# Patient Record
Sex: Female | Born: 1980 | Race: Black or African American | Hispanic: No | Smoking: Never smoker
Health system: Southern US, Community
[De-identification: ages and names within clinical notes are randomized; demographics above are authoritative.]

## PROBLEM LIST (undated history)

## (undated) DIAGNOSIS — Z923 Personal history of irradiation: Secondary | ICD-10-CM

## (undated) DIAGNOSIS — D649 Anemia, unspecified: Secondary | ICD-10-CM

## (undated) DIAGNOSIS — C539 Malignant neoplasm of cervix uteri, unspecified: Secondary | ICD-10-CM

## (undated) HISTORY — DX: Personal history of irradiation: Z92.3

---

## 2004-03-18 ENCOUNTER — Ambulatory Visit (HOSPITAL_COMMUNITY): Admission: RE | Admit: 2004-03-18 | Discharge: 2004-03-18 | Payer: Self-pay | Admitting: *Deleted

## 2004-04-26 HISTORY — PX: TUBAL LIGATION: SHX77

## 2004-06-03 ENCOUNTER — Ambulatory Visit: Payer: Self-pay | Admitting: *Deleted

## 2004-06-03 ENCOUNTER — Inpatient Hospital Stay (HOSPITAL_COMMUNITY): Admission: AD | Admit: 2004-06-03 | Discharge: 2004-06-06 | Payer: Self-pay | Admitting: *Deleted

## 2004-07-16 ENCOUNTER — Ambulatory Visit: Payer: Self-pay | Admitting: Obstetrics and Gynecology

## 2004-07-22 ENCOUNTER — Ambulatory Visit (HOSPITAL_COMMUNITY): Admission: RE | Admit: 2004-07-22 | Discharge: 2004-07-22 | Payer: Self-pay | Admitting: Obstetrics and Gynecology

## 2010-09-11 NOTE — Group Therapy Note (Signed)
NAME:  Peggy Pitts, Peggy Pitts NO.:  [id number]   MEDICAL RECORD NO.:  [id number]          PATIENT TYPE:  WOC   LOCATION:  [facility]                   FACILITY:  [facility]   PHYSICIAN:  Argentina Donovan, MD        DATE OF BIRTH:  10/27/1980   DATE OF SERVICE:  07/16/2004                                    CLINIC NOTE   REASON FOR VISIT:  The patient is a 30 year old gravida 3 para 3-0-0-3 who  had three vaginal deliveries, delivered her last baby February 9, and  desires sterilization. We have discussed bilateral tubal ligation as well as  the alternatives of vasectomy, etc. We have also gone over the risks and the  possible complications of laparoscopic surgery including that related to  hemorrhage, infection, bowel or urinary tract injury, and anesthetic  complications. The patient still wants this done. She weighs 202 pounds, is  5 feet 7 inches, and has a blood pressure of 125/86. She has no allergies,  has never had any surgery, and is in good health. Family history is  noncontributory and physical examination was normal as a postpartum patient.   DIAGNOSIS:  Desiring of voluntary sterilization. Will send the papers over  so that [place] can get this scheduled as soon as possible.      PR/MEDQ  D:  07/16/2004  T:  07/16/2004  Job:  [id number]

## 2010-09-11 NOTE — Op Note (Signed)
NAMESHADY, PADRON                 ACCOUNT NO.:  [id number]   MEDICAL RECORD NO.:  [id number]          PATIENT TYPE:  AMB   LOCATION:  [facility]                           [facility]  [facility]   PHYSICIAN:  Phil D. Okey Dupre, M.D.     DATE OF BIRTH:  1980-10-08   DATE OF PROCEDURE:  07/22/2004  DATE OF DISCHARGE:                                 OPERATIVE REPORT   PREOPERATIVE DIAGNOSIS:  Voluntary sterilization.   POSTOPERATIVE DIAGNOSIS:  Voluntary sterilization.   OPERATION PERFORMED:  Laparoscopic sterilization.   SURGEON:  Javier Glazier. Okey Dupre, M.D.   ANESTHESIA:  General.   SPECIMENS:  None.   POSTOPERATIVE CONDITION:  Satisfactory.   DESCRIPTION OF PROCEDURE:  Under satisfactory general anesthesia, the  patient in the dorsal lithotomy position, the perineum and vagina and  abdomen were prepped and draped in the usual sterile manner.  Bimanual  pelvic examination under anesthesia revealed the uterus was normal size,  shape, consistency, anteflexed, freely movable.  Weighted speculum was  placed in the posterior fourchette of the vagina.  The anterior lip of the  cervix grasped with a single toothed tenaculum.  An acorn cannula was placed  into the cervix and attached to a tenaculum on the anterior lip of the  cervix for mobilization of the uterus.  Veress needle inserted in the  peritoneal cavity at the lower pole of the umbilicus.  However, I could not  get a low enough reading and felt that we perhaps were not in the peritoneal  cavity and therefore, I proceeded into the peritoneum under direct vision as  an open laparoscopy, put the lesser laparoscopic trocar in, removed the  trocar from the sleeve and used Allis clamps to secure air tight area within  the peritoneal cavity and then the CO2 was directed into the peritoneal  cavity under direct vision until the pelvic organs could be easily  visualized and found to be within normal limits.  Each fallopian tube was  grasped in two areas, one in  the midportion and the second approximately 1  cm from the distal end and coagulated until blanching occurred.  At the end  of this as much CO2 as possible was expressed through the sleeve and the  sleeve was removed.  There still seemed to be subcutaneous extravasation of  CO2 at the time of closure and some tympany but no significant bleeding or  injury to other viscus was noted and the incision of fascia was closed with  a 0 Vicryl suture on an atraumatic needle which was continued up to the  subcutaneous closure and Dermabond was used to close the skin edges.  A dry  sterile dressing was applied.  The patient was then transferred to the  recovery room in satisfactory  condition after the tenaculum and the acorn cannula were removed from the  vagina.  There was minimal blood loss.  Tape, instrument, sponge and needle  counts were reported correct at the end of the procedure.  The patient was  in satisfactory condition in the recovery room.  PDR/MEDQ  D:  07/22/2004  T:  07/22/2004  Job:  [id number]

## 2012-01-11 ENCOUNTER — Encounter (HOSPITAL_COMMUNITY): Payer: Self-pay | Admitting: Emergency Medicine

## 2012-01-11 ENCOUNTER — Emergency Department (HOSPITAL_COMMUNITY)
Admission: EM | Admit: 2012-01-11 | Discharge: 2012-01-11 | Disposition: A | Discharge status: Home / Self Care | Payer: Self-pay | Attending: Emergency Medicine | Admitting: Emergency Medicine

## 2012-01-11 DIAGNOSIS — N760 Acute vaginitis: Secondary | ICD-10-CM | POA: Insufficient documentation

## 2012-01-11 DIAGNOSIS — Z202 Contact with and (suspected) exposure to infections with a predominantly sexual mode of transmission: Secondary | ICD-10-CM | POA: Insufficient documentation

## 2012-01-11 DIAGNOSIS — A499 Bacterial infection, unspecified: Secondary | ICD-10-CM | POA: Insufficient documentation

## 2012-01-11 DIAGNOSIS — B9689 Other specified bacterial agents as the cause of diseases classified elsewhere: Secondary | ICD-10-CM | POA: Insufficient documentation

## 2012-01-11 LAB — WET PREP, GENITAL
Trich, Wet Prep: NONE SEEN
Yeast Wet Prep HPF POC: NONE SEEN

## 2012-01-11 MED ORDER — AZITHROMYCIN 250 MG PO TABS
1000.0000 mg | ORAL_TABLET | Freq: Once | ORAL | Status: AC
  Administered 2012-01-11: 1000 mg via ORAL
  Filled 2012-01-11: qty 4

## 2012-01-11 MED ORDER — LIDOCAINE HCL (PF) 1 % IJ SOLN
INTRAMUSCULAR | Status: AC
  Filled 2012-01-11: qty 5

## 2012-01-11 MED ORDER — METRONIDAZOLE 500 MG PO TABS: 500.0000 mg | ORAL_TABLET | Freq: Two times a day (BID) | ORAL | Status: DC

## 2012-01-11 MED ORDER — CEFTRIAXONE SODIUM 250 MG IJ SOLR
250.0000 mg | Freq: Once | INTRAMUSCULAR | Status: AC
  Administered 2012-01-11: 250 mg via INTRAMUSCULAR
  Filled 2012-01-11: qty 250

## 2012-01-11 NOTE — ED Notes (Signed)
Pt sts yellow/green foul smelling vaginal discharge x 2 weeks; pt sts could have been exposed to STD

## 2012-01-11 NOTE — ED Provider Notes (Signed)
History     CSN: [id number]  Arrival date & time 01/11/12  6213   First MD Initiated Contact with Patient 01/11/12 [phone]      Chief Complaint  Patient presents with  . Exposure to STD  . Vaginal Discharge    (Consider location/radiation/quality/duration/timing/severity/associated sxs/prior treatment) HPI Comments: Patient presents with a 2 week history of malodorous vaginal discharge. Patient describes the discharge as white and milky that recently changed to yellow. She reports her husband is a Naval architect and thinks he is being unfaithful while working. She believes he gave her an STD. She denies abdominal pain, abnormal bleeding, menstrual irregularities. Her periods are regular and normal. She has a history of tubal ligation.   Patient is a 31 y.o. female presenting with STD exposure and vaginal discharge.  Exposure to STD  Vaginal Discharge    History reviewed. No pertinent past medical history.  History reviewed. No pertinent past surgical history.  History reviewed. No pertinent family history.  History  Substance Use Topics  . Smoking status: Never Smoker   . Smokeless tobacco: Not on file  . Alcohol Use: Yes     occ    OB History    Grav Para Term Preterm Abortions TAB SAB Ect Mult Living                  Review of Systems  Genitourinary: Positive for vaginal discharge.  All other systems reviewed and are negative.    Allergies  Review of patient's allergies indicates no known allergies.  Home Medications   Current Outpatient Rx  Name Route Sig Dispense Refill  . BIOTIN PO Oral Take 1 tablet by mouth 2 (two) times a week.      BP 164/89  Pulse 120  Temp 98.7 F (37.1 C) (Oral)  Resp 18  SpO2 100%  Physical Exam  Nursing note and vitals reviewed. Constitutional: She is oriented to person, place, and time. She appears well-developed and well-nourished. No distress.  HENT:  Head: Normocephalic and atraumatic.  Mouth/Throat: No  oropharyngeal exudate.  Eyes: Conjunctivae normal are normal. No scleral icterus.  Neck: Normal range of motion. Neck supple.  Cardiovascular: Normal rate and regular rhythm.  Exam reveals no gallop and no friction rub.   No murmur heard. Pulmonary/Chest: Effort normal and breath sounds normal. No respiratory distress. She has no wheezes. She has no rales. She exhibits no tenderness.  Abdominal: Soft. There is no tenderness.  Genitourinary:       Copious amount of clear yellow discharge in vagina. Tenderness noted on speculum exam. Patient endorses tenderness of right adnexa on bimanual exam. No abnormal masses noted on bimanual exam.   Musculoskeletal: Normal range of motion.  Neurological: She is alert and oriented to person, place, and time. Coordination normal.  Skin: Skin is warm and dry. She is not diaphoretic.  Psychiatric: She has a normal mood and affect. Her behavior is normal.    ED Course  Procedures (including critical care time)  Labs Reviewed  WET PREP, GENITAL - Abnormal; Notable for the following:    Clue Cells Wet Prep HPF POC MODERATE (*)     WBC, Wet Prep HPF POC TOO NUMEROUS TO COUNT (*)     All other components within normal limits  GC/CHLAMYDIA PROBE AMP, GENITAL  LAB REPORT - SCANNED   No results found.   No diagnosis found.    MDM  10:12 AM Will do pelvic exam and treat for STD exposure.  Patient treated with 250mg  IM rocephin and 1g Azithromycin. She will go home with Metronidazole for BV. No further evaluation needed at this time.        Emilia Beck, PA-C 01/19/12 1534

## 2012-01-12 LAB — GC/CHLAMYDIA PROBE AMP, GENITAL
Chlamydia, DNA Probe: NEGATIVE
GC Probe Amp, Genital: NEGATIVE

## 2012-01-22 NOTE — ED Provider Notes (Signed)
Medical screening examination/treatment/procedure(s) were performed by non-physician practitioner and as supervising physician I was immediately available for consultation/collaboration.  Flint Melter, MD 01/22/12 1125

## 2012-02-22 ENCOUNTER — Encounter (HOSPITAL_COMMUNITY): Payer: Self-pay | Admitting: *Deleted

## 2012-02-22 ENCOUNTER — Emergency Department (HOSPITAL_COMMUNITY)
Admission: EM | Admit: 2012-02-22 | Discharge: 2012-02-23 | Disposition: A | Discharge status: Home / Self Care | Payer: Medicaid Other | Attending: Emergency Medicine | Admitting: Emergency Medicine

## 2012-02-22 DIAGNOSIS — Z79899 Other long term (current) drug therapy: Secondary | ICD-10-CM | POA: Insufficient documentation

## 2012-02-22 DIAGNOSIS — N888 Other specified noninflammatory disorders of cervix uteri: Secondary | ICD-10-CM

## 2012-02-22 DIAGNOSIS — N9489 Other specified conditions associated with female genital organs and menstrual cycle: Secondary | ICD-10-CM | POA: Insufficient documentation

## 2012-02-22 LAB — CBC WITH DIFFERENTIAL/PLATELET
Basophils Absolute: 0 10*3/uL (ref 0.0–0.1)
Basophils Relative: 0 % (ref 0–1)
Eosinophils Absolute: 1 10*3/uL — ABNORMAL HIGH (ref 0.0–0.7)
Eosinophils Relative: 10 % — ABNORMAL HIGH (ref 0–5)
HCT: 33 % — ABNORMAL LOW (ref 36.0–46.0)
Hemoglobin: 10.7 g/dL — ABNORMAL LOW (ref 12.0–15.0)
Lymphocytes Relative: 29 % (ref 12–46)
Lymphs Abs: 2.9 10*3/uL (ref 0.7–4.0)
MCH: 22.8 pg — ABNORMAL LOW (ref 26.0–34.0)
MCHC: 32.4 g/dL (ref 30.0–36.0)
MCV: 70.2 fL — ABNORMAL LOW (ref 78.0–100.0)
Monocytes Absolute: 0.5 10*3/uL (ref 0.1–1.0)
Monocytes Relative: 5 % (ref 3–12)
Neutro Abs: 5.5 10*3/uL (ref 1.7–7.7)
Neutrophils Relative %: 56 % (ref 43–77)
Platelets: 270 10*3/uL (ref 150–400)
RBC: 4.7 MIL/uL (ref 3.87–5.11)
RDW: 18.1 % — ABNORMAL HIGH (ref 11.5–15.5)
WBC: 9.8 10*3/uL (ref 4.0–10.5)

## 2012-02-22 LAB — WET PREP, GENITAL
Trich, Wet Prep: NONE SEEN
Yeast Wet Prep HPF POC: NONE SEEN

## 2012-02-22 LAB — BASIC METABOLIC PANEL
BUN: 9 mg/dL (ref 6–23)
CO2: 21 mEq/L (ref 19–32)
Calcium: 9.3 mg/dL (ref 8.4–10.5)
Chloride: 100 mEq/L (ref 96–112)
Creatinine, Ser: 0.83 mg/dL (ref 0.50–1.10)
GFR calc Af Amer: >90 mL/min (ref >90)
GFR calc non Af Amer: >90 mL/min (ref >90)
Glucose, Bld: 94 mg/dL (ref 70–99)
Potassium: 3.2 mEq/L — ABNORMAL LOW (ref 3.5–5.1)
Sodium: 135 mEq/L (ref 135–145)

## 2012-02-22 LAB — URINALYSIS, ROUTINE W REFLEX MICROSCOPIC
Bilirubin Urine: NEGATIVE
Glucose, UA: NEGATIVE mg/dL
Ketones, ur: 15 mg/dL — AB
Leukocytes, UA: NEGATIVE
Nitrite: NEGATIVE
Protein, ur: NEGATIVE mg/dL
Specific Gravity, Urine: 1.031 — ABNORMAL HIGH (ref 1.005–1.030)
Urobilinogen, UA: 0.2 mg/dL (ref 0.0–1.0)
pH: 5.5 (ref 5.0–8.0)

## 2012-02-22 LAB — URINE MICROSCOPIC-ADD ON

## 2012-02-22 LAB — POCT PREGNANCY, URINE: Preg Test, Ur: NEGATIVE

## 2012-02-22 MED ORDER — SODIUM CHLORIDE 0.9 % IV BOLUS (SEPSIS)
1000.0000 mL | Freq: Once | INTRAVENOUS | Status: AC
  Administered 2012-02-22: 1000 mL via INTRAVENOUS

## 2012-02-22 NOTE — ED Notes (Signed)
Pt states seen and treated for bacterial vaginiosis. Pt states 2-3 weeks after antiobitic she started having same syptoms. Pt states discharge. Pt states slight bleeding during intercourse.

## 2012-02-22 NOTE — ED Provider Notes (Signed)
History     CSN: [id number]  Arrival date & time 02/22/12  2025   First MD Initiated Contact with Patient 02/22/12 2220      Chief Complaint  Patient presents with  . Vaginitis    (Consider location/radiation/quality/duration/timing/severity/associated sxs/prior treatment) HPI  31 year old female presents complaining of vaginal discharge.  Patient reports for the past 2 weeks she has been having vaginal discharge with clear liquid, and occasionally spotting after sexual intercourse. She also endorsed a vaginal odor. Onset was gradual, persistent, mild to moderate in severity, with no abdominal tenderness, and no urinary symptoms. She denies fever, chills, nausea, vomiting, diarrhea, back pain.  Patient was seen in the ED about month ago for similar symptoms. During her visit she was found to have evidence of bacterial vaginosis in which was treated with Flagyl. She was also given Rocephin and Zithromax for suspected STD. Her culture  result shows no evidence of Chlamydia or gonorrhea infection.  History reviewed. No pertinent past medical history.  Past Surgical History  Procedure Date  . Tubal ligation     History reviewed. No pertinent family history.  History  Substance Use Topics  . Smoking status: Never Smoker   . Smokeless tobacco: Not on file  . Alcohol Use: Yes     occ    OB History    Grav Para Term Preterm Abortions TAB SAB Ect Mult Living                  Review of Systems  All other systems reviewed and are negative.    Allergies  Review of patient's allergies indicates no known allergies.  Home Medications   Current Outpatient Rx  Name Route Sig Dispense Refill  . BIOTIN PO Oral Take 1 tablet by mouth 2 (two) times a week.      BP 136/86  Pulse 108  Temp 98.9 F (37.2 C) (Oral)  Resp 18  SpO2 100%  Physical Exam  Nursing note and vitals reviewed. Constitutional: She appears well-developed and well-nourished. No distress.  HENT:  Head:  Normocephalic and atraumatic.  Eyes: Conjunctivae normal are normal.  Neck: Normal range of motion. Neck supple.  Cardiovascular: Normal rate and regular rhythm.   Pulmonary/Chest: Effort normal and breath sounds normal. She exhibits no tenderness.  Abdominal: Soft. There is no tenderness. Hernia confirmed negative in the right inguinal area and confirmed negative in the left inguinal area.  Genitourinary: There is no rash or lesion on the right labia. There is no rash or lesion on the left labia. Cervix exhibits discharge and friability. Cervix exhibits no motion tenderness. There is bleeding around the vagina. No erythema or tenderness around the vagina. Vaginal discharge found.       Moderate frothy serous sanguianous discharge noted in vaginal canal.  A large tissue mass noted obstructing cervical os.  Mass is friable, nontender, firm.  Unable to remove using alligator forcep.    Bimanual examination were not performed due to finding above.    Musculoskeletal: She exhibits no edema and no tenderness.  Lymphadenopathy:       Right: No inguinal adenopathy present.       Left: No inguinal adenopathy present.  Neurological: She is alert.    ED Course  Procedures (including critical care time)   Labs Reviewed  POCT PREGNANCY, URINE  URINALYSIS, ROUTINE W REFLEX MICROSCOPIC   Results for orders placed during the hospital encounter of 02/22/12  URINALYSIS, ROUTINE W REFLEX MICROSCOPIC  Component Value Range   Color, Urine YELLOW  YELLOW   APPearance CLOUDY (*) CLEAR   Specific Gravity, Urine 1.031 (*) 1.005 - 1.030   pH 5.5  5.0 - 8.0   Glucose, UA NEGATIVE  NEGATIVE mg/dL   Hgb urine dipstick SMALL (*) NEGATIVE   Bilirubin Urine NEGATIVE  NEGATIVE   Ketones, ur 15 (*) NEGATIVE mg/dL   Protein, ur NEGATIVE  NEGATIVE mg/dL   Urobilinogen, UA 0.2  0.0 - 1.0 mg/dL   Nitrite NEGATIVE  NEGATIVE   Leukocytes, UA NEGATIVE  NEGATIVE  POCT PREGNANCY, URINE      Component Value Range     Preg Test, Ur NEGATIVE  NEGATIVE  WET PREP, GENITAL      Component Value Range   Yeast Wet Prep HPF POC NONE SEEN  NONE SEEN   Trich, Wet Prep NONE SEEN  NONE SEEN   Clue Cells Wet Prep HPF POC FEW (*) NONE SEEN   WBC, Wet Prep HPF POC TOO NUMEROUS TO COUNT (*) NONE SEEN  URINE MICROSCOPIC-ADD ON      Component Value Range   Squamous Epithelial / LPF FEW (*) RARE   WBC, UA 3-6  <3 WBC/hpf   RBC / HPF 3-6  <3 RBC/hpf   Bacteria, UA RARE  RARE   Urine-Other MUCOUS PRESENT    CBC WITH DIFFERENTIAL      Component Value Range   WBC 9.8  4.0 - 10.5 K/uL   RBC 4.70  3.87 - 5.11 MIL/uL   Hemoglobin 10.7 (*) 12.0 - 15.0 g/dL   HCT 21.3 (*) 08.6 - 57.8 %   MCV 70.2 (*) 78.0 - 100.0 fL   MCH 22.8 (*) 26.0 - 34.0 pg   MCHC 32.4  30.0 - 36.0 g/dL   RDW 46.9 (*) 62.9 - 52.8 %   Platelets 270  150 - 400 K/uL   Neutrophils Relative 56  43 - 77 %   Neutro Abs 5.5  1.7 - 7.7 K/uL   Lymphocytes Relative 29  12 - 46 %   Lymphs Abs 2.9  0.7 - 4.0 K/uL   Monocytes Relative 5  3 - 12 %   Monocytes Absolute 0.5  0.1 - 1.0 K/uL   Eosinophils Relative 10 (*) 0 - 5 %   Eosinophils Absolute 1.0 (*) 0.0 - 0.7 K/uL   Basophils Relative 0  0 - 1 %   Basophils Absolute 0.0  0.0 - 0.1 K/uL  BASIC METABOLIC PANEL      Component Value Range   Sodium 135  135 - 145 mEq/L   Potassium 3.2 (*) 3.5 - 5.1 mEq/L   Chloride 100  96 - 112 mEq/L   CO2 21  19 - 32 mEq/L   Glucose, Bld 94  70 - 99 mg/dL   BUN 9  6 - 23 mg/dL   Creatinine, Ser 4.13  0.50 - 1.10 mg/dL   Calcium 9.3  8.4 - 24.4 mg/dL   GFR calc non Af Amer >90  >90 mL/min   GFR calc Af Amer >90  >90 mL/min   No results found.  1. Cervical mass  MDM  Pt with vaginal discharge and abnormal bleeding.  On exam, pt appears to have a friable mass in vaginal canal obstructing cervical os.  Mass is friable, nontender, unable to removed using alligator forcep.  My attending has also evaluated it.  Plan to obtain US for further evaluation.  Pt otherwise  denies hx of cancer, unexplained  weight loss, fever, body aches, or nightsweats.     12:10 AM Report given to Dr. Norlene [place], who will follow up on [place] result and will dispo as appropriate.    BP 147/87  Pulse 117  Temp 98.8 F (37.1 C) (Oral)  Resp 19  SpO2 100%  I have reviewed nursing notes and vital signs. I personally reviewed the imaging tests through PACS system  I reviewed available ER/hospitalization records thought the EMR        Fayrene Helper, PA-C 02/23/12 0010

## 2012-02-23 ENCOUNTER — Emergency Department (HOSPITAL_COMMUNITY): Payer: Medicaid Other

## 2012-02-23 ENCOUNTER — Telehealth: Payer: Self-pay | Admitting: Obstetrics & Gynecology

## 2012-02-23 NOTE — ED Provider Notes (Signed)
Medical screening examination/treatment/procedure(s) were conducted as a shared visit with non-physician practitioner(s) and myself.  I personally evaluated the patient during the encounter   Loren Racer, MD 02/23/12 [phone]

## 2012-02-23 NOTE — ED Notes (Signed)
Pt to ultrasound

## 2012-02-23 NOTE — ED Notes (Signed)
MD Otter at bedside. 

## 2012-02-24 LAB — GC/CHLAMYDIA PROBE AMP, GENITAL
Chlamydia, DNA Probe: NEGATIVE
GC Probe Amp, Genital: NEGATIVE

## 2012-02-24 NOTE — Telephone Encounter (Signed)
Rx sent to pharmacy  Peggy Pitts

## 2012-02-28 ENCOUNTER — Ambulatory Visit (INDEPENDENT_AMBULATORY_CARE_PROVIDER_SITE_OTHER): Payer: Self-pay | Admitting: Obstetrics & Gynecology

## 2012-02-28 ENCOUNTER — Encounter: Payer: Self-pay | Admitting: Obstetrics & Gynecology

## 2012-02-28 ENCOUNTER — Other Ambulatory Visit (HOSPITAL_COMMUNITY)
Admission: RE | Admit: 2012-02-28 | Discharge: 2012-02-28 | Disposition: A | Discharge status: Home / Self Care | Payer: Self-pay | Source: Ambulatory Visit | Attending: Obstetrics & Gynecology | Admitting: Obstetrics & Gynecology

## 2012-02-28 VITALS — BP 149/92 | HR 95 | Temp 98.2°F | Ht 66.0 in | Wt 250.6 lb

## 2012-02-28 DIAGNOSIS — C539 Malignant neoplasm of cervix uteri, unspecified: Secondary | ICD-10-CM | POA: Insufficient documentation

## 2012-02-28 DIAGNOSIS — N888 Other specified noninflammatory disorders of cervix uteri: Secondary | ICD-10-CM | POA: Insufficient documentation

## 2012-02-28 HISTORY — PX: CERVICAL BIOPSY: SHX590

## 2012-02-28 NOTE — Progress Notes (Signed)
Patient ID: Peggy Pitts, female   DOB: 09-Jul-1980, 31 y.o.   MRN: [id number]  Chief Complaint  Patient presents with  . Mass    cervical     HPI Peggy Pitts is a 31 y.o. female.  Referral for cervical mass.Seen in ER 10/30. Last pap years ago HPI  No past medical history on file.  Past Surgical History  Procedure Date  . Tubal ligation     No family history on file.  Social History History  Substance Use Topics  . Smoking status: Never Smoker   . Smokeless tobacco: Never Used  . Alcohol Use: No     Comment: occ    No Known Allergies  Current Outpatient Prescriptions  Medication Sig Dispense Refill  . BIOTIN PO Take 1 tablet by mouth 2 (two) times a week.        Review of Systems Review of Systems  Gastrointestinal: Positive for abdominal pain.  Genitourinary: Positive for vaginal bleeding and vaginal discharge.    Blood pressure 149/92, pulse 95, temperature 98.2 F (36.8 C), temperature source Oral, height 5\' 6"  (1.676 m), weight 250 lb 9.6 oz (113.671 kg), last menstrual period 01/30/2012.  Physical Exam Physical Exam  Constitutional: She appears well-developed. No distress.  Abdominal: She exhibits no distension and no mass.  Genitourinary: Vaginal discharge found.       Thin watery discharge with some blood. Necrotic-appearing firm mass at the cervix. Measures about 6 cm and appears to be attached to the cervix. Uterus is normal size no adnexal masses. She gave consent for biopsy after the procedure was explained and timeout was performed. Cervical biopsies were obtained and sent to pathology.  Neurological: She is alert.  Skin: Skin is warm and dry. No pallor.  Psychiatric: She has a normal mood and affect.    Data Reviewed CBC    Component Value Date/Time   WBC 9.8 02/22/2012 2301   RBC 4.70 02/22/2012 2301   HGB 10.7* 02/22/2012 2301   HCT 33.0* 02/22/2012 2301   PLT 270 02/22/2012 2301   MCV 70.2* 02/22/2012 2301   MCH 22.8*  02/22/2012 2301   MCHC 32.4 02/22/2012 2301   RDW 18.1* 02/22/2012 2301   LYMPHSABS 2.9 02/22/2012 2301   MONOABS 0.5 02/22/2012 2301   EOSABS 1.0* 02/22/2012 2301   BASOSABS 0.0 02/22/2012 2301   *RADIOLOGY REPORT*  Clinical Data: Vaginal bleeding. Mass.  TRANSABDOMINAL AND TRANSVAGINAL ULTRASOUND OF PELVIS  Technique: Both transabdominal and transvaginal ultrasound  examinations of the pelvis were performed. Transabdominal technique  was performed for global imaging of the pelvis including uterus,  ovaries, adnexal regions, and pelvic cul-de-sac.  It was necessary to proceed with endovaginal exam following the  transabdominal exam to visualize the uterus and adnexa.  Comparison: None  Findings:  Uterus: The uterus is of normal size and echotexture, measuring  11.3 x 5.8 x 9.0 cm. No focal uterine mass is evident.  Endometrium: The endometrium is within normal limits for age 43 mm.  Right ovary: The right ovary is of normal size and echotexture  measuring 3.3 x 2.1 x 2.0 cm.  Left ovary: The left ovary is of normal size and echotexture  measuring 4.3 x 1.9 x 3.1 cm. A dominant follicle measures 1.8 cm.  Other findings: An irregular heterogeneous mass is present as at  the level of the cervix and within the vagina. This lesion  measures 6.7 x 4.8 x 5.1 cm. Moderate to color Doppler flow is  present  within the mass.  IMPRESSION:  1. 6.7 cm cervical were vaginal mass is most concerning for a  cervical neoplasm.  2. The uterus and adnexa are otherwise within normal limits.  Original Report Authenticated By: Jamesetta Orleans. MATTERN, M.D.    Assessment    Necrotic cervical mass    Plan    Return for BX result          Alexx Mcburney 02/28/2012, 4:46 PM

## 2012-02-28 NOTE — Patient Instructions (Signed)
Cervical Biopsy A cervical biopsy is the removal of a small sample of tissue from the cervix. The cervix is the lowest part of the womb (uterus). The womb contains the opening of the uterus into the vagina (birth canal). A cervical biopsy is usually done to detect cancer of the cervix. A cervical biopsy is also done following an abnormal pap smear, or when an abnormality is seen on the cervix during a pelvic examination. A pap smear is a test of your cervix and cervical canal. During a pap smear, your caregiver uses a small spatula and a brush. He/she uses these tools to gently scrape cells from inside the canal of the cervix and from the surface of your cervix. These cells are sent to a lab for testing. The pap smear is a screening test to detect early changes in cells which suggest cancer will develop. Abnormal pap smears often lead to a colposcopy. A colposcopy is an examination of the surface of the cervix through a magnifying scope. It is usually done if:  You have an abnormal pap smear.  You have a lesion on the cervix, vulva, or vagina, with or without an abnormal pap smear.  Your pap smear suggests the presence of the herpes virus or the human papilloma virus (HPV). This virus can cause genital warts. Several HPV types are linked to the development of cervical cancer.  You have extensive genital warts on the vulva. These are the lips at the opening of the vagina.  You were exposed to DES, or diethylstilbestrol during your mother's pregnancy. This medicine has been linked with abnormal changes in the cervix of women exposed to DES as fetuses.  Your caregiver can safely perform a cervical biopsy while you are pregnant. Your caregiver will often wait until a time after delivery, if the pap smear does not indicate cancer cells. PROCEDURE  Do not douche or have sexual intercourse for at least 24 hours before doing the biopsy. A cervical biopsy is done with the woman lying on her back. Her feet  will be placed in stirrups (foot rests). The biopsy is done when a woman does not have her menstrual period. The caregiver places a speculum inside the woman's vagina. This instrument helps hold open the opening of the vagina. This allows the caregiver to see the cervix and inside of the vagina. The caregiver uses a colposcope to magnify the cervix, vulva, and vagina if necessary. Your caregiver will put a mild solution of vinegar on the area. This solution makes abnormal cells more visible. The caregiver may also use a solution of weak iodine to help see any abnormal cells.  The caregiver takes small pieces of tissue from suspicious areas. When using the colposcope, the technique is called directed cervical punch biopsy. A small amount of paste is placed on the biopsy site to prevent bleeding. You may feel mild discomfort during the biopsy. Your caregiver will record the location of the abnormal areas. Tissue samples will be sent to the lab to a specialist, to see if there are any abnormal cells. The specialist will examine your biopsy under a microscope.  HOME CARE INSTRUCTIONS   Cramping usually goes away within minutes of the cervical biopsy.  Lying down for a few minutes usually prevents light headedness.  Cramping may be treated with over-the-counter medicines. Only take over-the-counter or prescription medicines for pain or discomfort as directed by your caregiver.  Your caregiver will discuss the cervical biopsy test results with you. Problems can range  from normal to mild or slightly abnormal changes in the cells, to cancer of the cervix. Treatments and follow-up depend upon what is found on the biopsy.  You may have a small amount of minor bleeding from the vagina for 1 or 2 days.  For a week (or as instructed) avoid:  Sexual intercourse.  Douches.  Tampons.  Complications may include:  Heavier vaginal bleeding.  Infection.  Allergic reaction to the iodine used. SEEK MEDICAL  CARE IF:   You develop a rash.  You develop abnormal vaginal discharge.  You develop itching or irritation of the vagina or vulva.  You become dizzy or lightheaded. SEEK IMMEDIATE MEDICAL CARE IF:   You develop heavy vaginal bleeding.  You develop a temperature of 100 F (37.9 C) or higher.  You pass out.  Any new problems develop. Document Released: 04/12/2005 Document Revised: 07/05/2011 Document Reviewed: 02/17/2009 [facility] Patient Information 2013 [place], [place].

## 2012-03-03 ENCOUNTER — Ambulatory Visit: Payer: Self-pay

## 2012-03-06 ENCOUNTER — Encounter: Payer: Self-pay | Admitting: Obstetrics & Gynecology

## 2012-03-06 ENCOUNTER — Ambulatory Visit (INDEPENDENT_AMBULATORY_CARE_PROVIDER_SITE_OTHER): Payer: Self-pay | Admitting: Obstetrics & Gynecology

## 2012-03-06 VITALS — BP 133/77 | HR 112 | Temp 97.5°F | Ht 66.0 in | Wt 248.0 lb

## 2012-03-06 DIAGNOSIS — C539 Malignant neoplasm of cervix uteri, unspecified: Secondary | ICD-10-CM | POA: Insufficient documentation

## 2012-03-06 NOTE — Addendum Note (Signed)
Addended by: Toula Moos on: 03/06/2012 04:55 PM   Modules accepted: Orders

## 2012-03-06 NOTE — Patient Instructions (Signed)
Cervical Cancer The cervix is the opening and bottom part of the uterus between the birth canal (vagina) and the uterus (womb). Precancerous changes in the cells on the top layer of the cervix are an early sign that cervical cancer may develop. Cervical cancer is a fairly common cancer. It occurs most often in women ages 40 to 55. Cells of the cervix act very much like skin cells. These cells are exposed to toxins, viruses, and germs (bacteria) that may cause abnormal changes (cervical dysplasia).  Cervical dysplasia (abnormal growth) is judged by the thickness of the layer of abnormal cells. The earliest change that can be seen with a microscope is called mild dysplasia. If not treated, these precancerous changes may become moderate to severe, to cancer cells on the surface of the cervix (carcinoma in situ), to invasive cancer (cancer cells below the surface of the cervix). There are two kinds of cancers of the cervix:  Squamous (scale-like) cell carcinoma, starts in the flat or scale-like cells that line the cervix. This type of cancer is a sexually transmitted infection caused by the human papilloma virus (HPV).  Adenocarcinoma (starts on the surface of a gland) starts in glandular cells that line the cervix. CAUSES The risk of getting cancer of the cervix is related to your lifestyle, sexual history, health, and your immune system. Causes and risks include:  Having a sexually transmitted virus infection. These infections are strongly linked with cancer of the cervix. They include:  Chlamydia.  Herpes.  Human papilloma virus (HPV).  Becoming sexually active before age 18.  Having more than 1 sexual partner, or having sex with someone who has more than one sex partner.  Not using condoms with sexual partners.  Having had cancer of the vagina or vulva. Or having sex with someone whose previous sexual partner had cancer of the cervix or cervical dysplasia.  Having a sexual partner who has  or had cancer of the penis.  Smoking.  Having a weakened immune system. For example, HIV or other immune deficiency disorders (organ transplant).  Being the daughter of a woman who took DES (diethylstilbestrol) during pregnancy.  A history of cancer of the cervix in your sister or mother.  African/American, Hispanic, and Asian/Pacific Islander women have a higher risk of getting cervical cancer.  A previous history of dysplasia (abnormal growth) of the cervix. SYMPTOMS  Early stages of cervical cancer usually have no symptoms. Once the cancer invades the cervix and surrounding tissues, the woman may have vague symptoms, such as:  Abnormal vaginal bleeding or menstrual bleeding that is longer or heavier than usual.  Bleeding after intercourse, douching, or a pap test.  Vaginal bleeding following menopause.  Abnormal vaginal discharge.  Pelvic discomfort or pain.  Having an abnormal Pap test. (A Pap test is an exam where cells are scraped from the cervix and canal of the cervix, and are viewed under a microscope.) Symptoms of more advanced cervical cancer may include:  Loss of appetite or weight loss.  Tiredness (fatigue).  Back and leg pain.  Inability to control urination or bowel movements. DIAGNOSIS  Diagnosis of cervical cancer is found earliest and done best with regular pelvic exams and Pap tests. If abnormalities are found, the Pap test may be repeated in 3 months, or your caregiver may do additional tests, such as:  Colposcopy: A special microscope allows the caregiver to magnify and closely examine the cells of the cervix, vagina, and vulva.  Cervical biopsies: Small tissue samples are   taken from the cervix to be examined under a microscope by a specialist. This can be done in your caregiver's office. This is done to determine if there is dysplasia or cancer cells. You cannot tell the stage of cervical cancer with a cervical biopsy. A cone biopsy removes tissue to be  tested for cancer. It can also be used to remove all the cancerous tissue. In a cone biopsy, a large, cone shaped tissue from the cervix is taken. This procedure can be done by:  Cold cone biopsy or laser cone. Both are done in an operating room under an anesthetic (you are asleep).  LEEP (loop electrosurgical excision procedure). Can be done in a doctor's office with a local anesthetic.  Other tests may be needed, including:  Cystoscopy (looking into the bladder with a lighted tube).  Proctoscopy or Sigmoidoscopy (looking into the rectum and lower intestine with a lighted tube).  Ultrasound.  CT Scan.  MRI.  Laparoscopy (using a lighted tube for examination). There are different stages of cervical cancer:   Stage 0. CIS (carcinoma in situ). This first stage of cancer is the last and most serious stage of dysplasia (see above).  Stage 1. The tumor is in the uterus and cervix only.  Stage 2. The tumor has spread to the upper vagina. The cancer has spread beyond the uterus, but not to the pelvic walls or lower third of the vagina.  Stage 3. The tumor has invaded the side wall of the pelvis, and the lower third of the vagina. Blockage of the ureters (tubes that carry urine) from the tumor may cause urine to back up and swell the kidneys (hydronephrosis).  Stage 4. The tumor has spread to the rectum or bladder. In the later part of this stage, it has also spread to distant organs, like the lungs. If abnormal cells are found early, it may be possible to avoid removing the uterus. If caught at an early stage, a woman can still have children and chances for a cure are good. Once cervical cancer reaches a late stage, more aggressive measures may be needed. Untreated, the cancer may spread to nearby areas or more distant sites in the body, through the blood and lymphatic system. Cervical cancer is not contagious and does not pose a risk to others.  TREATMENT  Options for removal include the  following:  Cone biopsy (see above).  Removal of the entire uterus and cervix (simple hysterectomy).  If the cancer has invaded deeper layers of the cervix and has spread to the uterus, more extensive treatment may be needed. This may include removal of the uterus, cervix, upper vagina, lymph nodes, and surrounding tissue (modified radical hysterectomy). This procedure depends on the extent of the cancer and a woman's age. The ovaries may be left in place or removed.  Sometimes medicines for treating cancer (radiation and/or chemotherapy) can be used. This is done when the cancer has spread beyond the cervix and uterus. These treatments may be used if a woman is not a good candidate for surgery. Age or other medical conditions may prevent a woman from being a good candidate for surgery. Radiation therapy may be used before or after surgery to shrink tumor cells and kill any remaining tumor cells.  A combination of surgery, radiation, and chemotherapy may be needed, depending on the extent of the cancer.  Biological response modifiers (BRMs) are substances that help strengthen the immune system's fight against cancer or infection. Interferon is a BRM that   is sometimes used in the treatment of cervical cancer. It may be used in combination with chemotherapy.  Treatment of squamous cell cancer or adenocarcinoma of the cervix are essentially the same. Side effects of treatment:  A hysterectomy may cause inability to control urination or psychological sexual problems. There may be swelling in the legs if lymph nodes are removed. Occasionally, blood transfusions may be required. Allergic reactions can occur. Hemorrhage, infection, and rarely death, can occur.  Chemotherapy and radiation therapy may cause a wide variety of side effects. Including:  Hair loss.  Tiredness (fatigue).  Lessened ability to fight infections.  Feeling sick to your stomach (nausea).  Vomiting.  Diarrhea.  Urinary  problems.  Atrophic vaginitis (inflammation of the vagina) with painful intercourse.  Biological response modifiers such as interferon may cause flu-like symptoms. These include:  Body aches.  Nausea.  Vomiting.  Fatigue. Treatment of cervical cancer in a pregnant woman:  Treatment of cervical cancer in pregnant women depends on the patient's culture, religious feelings, and ethical considerations.  A pregnant woman with cancer at Stage 0 or Stage 1, with microinvasion of 3mm or less (cancerous tissue has spread to a very small area), can deliver her baby vaginally. She can then be treated 6 weeks after the delivery, with surgery.  Other factors that can determine treatment include:  Size of the tumor.  Location of the tumor.  Stage of the pregnancy.  Desire of the patient to go on with the pregnancy.  Radiation with or without chemotherapy, following delivery and/or surgery, may be advised or necessary to prevent the cancerous tumor from coming back (recurrences).  Delaying treatment until the baby has a better chance to survive is better for the baby. Follow-up on cervical cancer:  After treatment, follow-up is important to look for reoccurrence.  A pelvic exam and Pap test, if the cervix is intact, will be done every 3 months for at least 2 years. After the 2 year phase, a pelvic exam and Pap test will be done every 6 months. Because cancer tends to come back at the same spot or spread to the lungs and liver, chest x-rays and liver function tests are done yearly.  If a woman has had a hysterectomy, the top of the vagina is cuffed or closed. A colposcopy may be done at follow-up visits to examine the vaginal cuff.  Tell your caregiver about any new or worsening problems. LENGTH OF ILLNESS The outcome for a woman with cervical cancer depends on many factors, including:  Overall health.  Age when first diagnosed.  The type and growth of specific cancer cells.  How  far the disease has spread. After treatment, the length of time to live depends on the stage of the cancer. Your caregiver will discuss this with you and help you plan your treatment for the best possible outcome. PREVENTION   A Pap test is done to screen for cervical cancer.  The first Pap test should be done at age 21.  Between ages 21 and 29, Pap tests are repeated every 2 years.  Beginning at age 30, you are advised to have a Pap test every 3 years as long as your past 3 Pap tests have been normal.  Some women have medical problems that increase the chance of getting cervical cancer. Talk to your caregiver about these problems. It is especially important to talk to your caregiver if a new problem develops soon after your last Pap test. In these cases, your caregiver   may recommend more frequent screening and Pap tests.  The above recommendations are the same for women who have or have not gotten the vaccine for HPV (Human Papillomavirus).  If you had a hysterectomy for a problem that was not a cancer or a condition that could lead to cancer, then you no longer need Pap tests. However, even if you no longer need a Pap test, a regular exam is a good idea to make sure no other problems are starting.   If you are between ages 65 and 70, and you have had normal Pap tests going back 10 years, you no longer need Pap tests. However, even if you no longer need a Pap test, a regular exam is a good idea to make sure no other problems are starting.   If you have had past treatment for cervical cancer or a condition that could lead to cancer, you need Pap tests and screening for cancer for at least 20 years after your treatment.  If Pap tests have been discontinued, risk factors (such as a new sexual partner)need to be re-assessed to determine if screening should be resumed.  Some women may need screenings more often if they are at high risk for cervical cancer.  A woman can lower her risk for  getting cervical cancer by:  Quitting smoking.  Waiting to have intercourse until age 18 or later.  Having only one sexual partner in a lifetime.  Using latex condoms.  Practicing safe sex with each sexual encounter.  Remaining celibate (not having sex). Celibate women do not get squamous cell cancer of the cervix, but they can get adenocarcinoma of the cervix.  A woman should ask her sexual partners about their sexual histories. By doing this, she can avoid partners that are high risk.  Identifying early warning signs of cervical cancer is also important. A woman should see her caregiver, and may need to be treated, if she has any of the following signs or symptoms:  Vaginal discharge that does not seem normal.  Vaginal bleeding between periods.  Bleeding with intercourse or after menopause.  Pain with intercourse (dyspareunia).  Vaccines are available to help prevent certain types of human papilloma virus infection and cervical cancer. HOME CARE INSTRUCTIONS   Get a yearly gynecology examination and Pap test, or as advised by your caregiver.  Get the Human papilloma virus (HPV) vaccine.  Do not smoke.  Tell your caregiver if you have a family history of cancer of the cervix.  Do not have sexual intercourse before age 18.  Practice safe sex:  Use condoms.  Have one sex partner.  Make sure you are the only sex partner of your sex partner. SEEK MEDICAL CARE IF:   You have abnormal vaginal bleeding.  You have abnormal vaginal discharge.  You have vaginal bleeding after sexual intercourse, douching, or using tampons.  You develop vaginal bleeding after menopause.  You have pain with sexual intercourse.  You have unexplained weight loss.  You have unexplained tiredness.  You feel pressure with urination and/or with a bowel movement. SEEK IMMEDIATE MEDICAL CARE IF:   You have heavy vaginal bleeding, with or without clots.  You cannot urinate, or you have  blood in your urine.  You have blood or pressure with a bowel movement.  You develop severe back, stomach, or pelvic pain.  You develop an unexplained temperature of 100 F (37.9 C), or higher. Document Released: 04/12/2005 Document Revised: 07/05/2011 Document Reviewed: 02/19/2009 [employer] Patient Information 2013   [employer]  

## 2012-03-06 NOTE — Progress Notes (Signed)
Patient ID: Peggy Pitts, female   DOB: 27-Oct-1980, 31 y.o.   MRN: [id number] Patient reports less vaginal bleeding. Her biopsy shows poorly differentiated SCC. I explained the diagnosis and the need for referral to Gyn Oncology for further evaluation and management. Her questions were answered and she accepts the referral.   ARNOLD,JAMES 03/06/2012 12:18 PM

## 2012-03-13 ENCOUNTER — Other Ambulatory Visit: Payer: Self-pay | Admitting: Obstetrics & Gynecology

## 2012-03-13 ENCOUNTER — Ambulatory Visit (HOSPITAL_COMMUNITY)
Admission: RE | Admit: 2012-03-13 | Discharge: 2012-03-13 | Disposition: A | Discharge status: Home / Self Care | Payer: Self-pay | Source: Ambulatory Visit | Attending: Obstetrics & Gynecology | Admitting: Obstetrics & Gynecology

## 2012-03-13 DIAGNOSIS — C539 Malignant neoplasm of cervix uteri, unspecified: Secondary | ICD-10-CM

## 2012-03-13 MED ORDER — IOHEXOL 300 MG/ML  SOLN
125.0000 mL | Freq: Once | INTRAMUSCULAR | Status: AC | PRN
  Administered 2012-03-13: 125 mL via INTRAVENOUS

## 2012-03-15 ENCOUNTER — Ambulatory Visit: Payer: Self-pay | Attending: Gynecologic Oncology | Admitting: Gynecologic Oncology

## 2012-03-15 ENCOUNTER — Encounter: Payer: Self-pay | Admitting: Gynecologic Oncology

## 2012-03-15 VITALS — BP 122/62 | HR 86 | Temp 98.8°F | Resp 18 | Ht 66.0 in | Wt 249.0 lb

## 2012-03-15 DIAGNOSIS — R599 Enlarged lymph nodes, unspecified: Secondary | ICD-10-CM | POA: Insufficient documentation

## 2012-03-15 DIAGNOSIS — C539 Malignant neoplasm of cervix uteri, unspecified: Secondary | ICD-10-CM | POA: Insufficient documentation

## 2012-03-15 NOTE — Patient Instructions (Signed)
Return for appointments once Harriett Sine calls you with them.

## 2012-03-15 NOTE — Progress Notes (Signed)
Consult Note: Gyn-Onc  Peggy Pitts 31 y.o. female  CC:  Chief Complaint  Patient presents with  . Cervical Cancer    New consult    HPI:  Patient is seen today in consultation at the request of Dr. Debroah Loop for newly diagnosed cervical cancer.  Patient is a 31 year old gravida 3 para 3 who has not had a Pap smear approximately 7 years. At the end of September she began having some vaginal discharge and went to the emergency room where she was told should bacterial vaginosis. She was treated with antibiotics for 14 days. While the odor diminished, the discharge continued. The odor came back and she was again seen in the emergency room on November 4. 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 In addition, she also had transvaginal ultrasound revealed an irregular heterogeneous mass present at the level of the cervix and within the vagina measuring 6.7 x 4.8 x 5.1 cm in size. It is for this reason that she is return to see [place] today.  She had a CT scan performed recently that revealed the following:  CT CHEST  Findings: Ill-defined anterior mediastinal tissue likely represents residual thymic tissue given its indistinct margination.  Small axillary lymph nodes do not appear pathologically enlarged. No pathologic mediastinal adenopathy noted. No significant pulmonary nodules.  IMPRESSION:  1. No findings of malignancy in the thorax.  CT ABDOMEN AND PELVIS  Findings: The liver, spleen, pancreas, and adrenal glands appear unremarkable. The gallbladder and biliary system appear unremarkable.  Small scattered retroperitoneal lymph nodes are not pathologically enlarged by size criteria. Several pericecal lymph nodes are also  within normal limits. Scattered small mesenteric lymph nodes are within normal limits. Left upper internal iliac lymph node measures 1.7 cm in short axis on image 91 of series 2, slight surrounding  stranding. A similar right sided internal iliac node measures 1.4 cm in short axis on  image 94 of series 2. Multiple additional pathologically enlarged pelvic lymph nodes include a 1.8 cm short axis right internal iliac  node and a 1.4 cm short axis left pelvic sidewall node on image 102 of series 2.   Large cervical mass measures 7 cm transversely. Poor fat plane definition noted between this mass in the posterior urinary bladder  wall. A nonspecific 2.3 cm hypodense lesion of the left ovary is observed. The right ovary is somewhat indistinct. Small bilateral inguinal lymph nodes are not pathologically enlarged by size criteria.  IMPRESSION:  1. Cervical mass with bilateral internal iliac and left pelvic sidewall pathologic adenopathy. A definite intact fat plane between the mass in the posterior urinary bladder is difficult to ensure. The mass does not appear to directly involve the rectum.  2. Probable low density cystic lesion of the inferior left ovary, probably a follicle or small complex cyst  Review of Systems: She is currently not sexually active. She does admit to postcoital bleeding for about 2 months with no dyspareunia. Her last cycle was October 7. She had repeat bleeding November 7 was assured that was related to biopsy the procedures are her regular.. She has a change about bladder habits any pain any chest pain or shortness of breath. She does have some, pain which is present when riding in the car and the road is bumpy.  She has a 31 year old son and a 65 year old daughter and a 88-year-old son.  Current Meds:  Outpatient Encounter Prescriptions as of 03/15/2012  Medication  Sig Dispense Refill  . BIOTIN PO Take 1 tablet by mouth 2 (two) times a week.        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 Marital Status: Married    Spouse Name: N/A    Number of Children: N/A  . Years of Education:  N/A   Occupational History  . Not on file.   Social History Main Topics  . Smoking status: Never Smoker   . Smokeless tobacco: Never Used  . Alcohol Use: Yes     Comment: occ  . Drug Use: No  . Sexually Active: Not Currently   Other Topics Concern  . Not on file   Social History Narrative  . No narrative on file    Past Surgical Hx: She has had 3 spontaneous vaginal deliveries. Past Surgical History  Procedure Date  . Tubal ligation     Past Medical Hx: No past medical history on file. None  Family Hx: No family history on file. Her mother has had hypertension and diabetes. There are no GYN cancers in the family. All her siblings are alive and well and her children are alive and well with no medical problems.  Vitals:  Blood pressure 122/62, pulse 86, temperature 98.8 F (37.1 C), temperature source Oral, resp. rate 18, height 5\' 6"  (1.676 m), weight 249 lb (112.946 kg), last menstrual period 03/12/2012.  Physical Exam: Nourished well developed female in no acute distress. BMI is 40.5.  Neck: Supple, no lymphadenopathy, no thyromegaly.  Lungs: Clear to auscultation bilaterally.  Cardiovascular tachycardic regular rhythm no murmurs.  Abdomen: Soft, nontender, nondistended. There is no palpable masses or hepatosplenomegaly. Exam is limited by habitus.  Groins: No lymphadenopathy.  Extremities: No edema.  Pelvic: A strong odor from the perineum. There are no external lesions on the genitalia. Vagina is without lesions. There's a large copious amount of watery purulent fluid that pools in the vagina and the speculum. I visually there is a large mass completely replacing the cervix that is bloody, necrotic, and friable. On bimanual examination she is an 8 cm cervical lesion. No obvious vesicovaginal fistula was identified. Due to the size of the lesion is difficult to ascertain parametrial involvement. Does appear that the parametria is involved on the patient's right side.  There may be sidewall involvement on the left but that is not quite as definitive.  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 On CT scan she does have enlarged lymph nodes worrisome for metastatic disease. . I would recommend that we proceed with a PET/CT to completely a staging disease. She will need to referred for primary chemotherapy and radiation. I discussed the CT scan results with her. She became quite tearful. She appears to be understand the gravity of the situation. We will work with her to get a PET/CT scheduled hopefully next week due to her work schedule we may defer to them next week so that she does not miss work and she's in a job. We will then get her scheduled for chemotherapy and radiation with medical oncology and radiation oncology. I discussed with her that she receive external beam radiation for 4-6 weeks in the depending on her response have some type of internal brachii therapy. Also discussed with her that she'll have weekly chemotherapy consisting of single agent cisplatin. We discussed the rationale for the radiation and chemotherapy she  seems to understand this. Her questions were elicited and answered to her satisfaction. She will await our call next week with her appointments.   Cleda Mccreedy A., MD 03/15/2012, 2:28 PM

## 2012-03-17 ENCOUNTER — Other Ambulatory Visit: Payer: Self-pay | Admitting: *Deleted

## 2012-03-17 ENCOUNTER — Telehealth: Payer: Self-pay | Admitting: *Deleted

## 2012-03-17 ENCOUNTER — Encounter: Payer: Self-pay | Admitting: Radiation Oncology

## 2012-03-17 DIAGNOSIS — C539 Malignant neoplasm of cervix uteri, unspecified: Secondary | ICD-10-CM

## 2012-03-17 NOTE — Telephone Encounter (Signed)
Pt given these appts: Dr Roselind Messier 03/20/12 @ 0800.  PET scan 03/22/12 @ noon.  Still pending is the med onc appt which will be ready when she comes in on monday

## 2012-03-20 ENCOUNTER — Telehealth: Payer: Self-pay | Admitting: Oncology

## 2012-03-20 ENCOUNTER — Ambulatory Visit
Admission: RE | Admit: 2012-03-20 | Discharge: 2012-03-20 | Disposition: A | Discharge status: Home / Self Care | Payer: Self-pay | Source: Ambulatory Visit | Attending: Radiation Oncology | Admitting: Radiation Oncology

## 2012-03-20 ENCOUNTER — Encounter: Payer: Self-pay | Admitting: Radiation Oncology

## 2012-03-20 VITALS — BP 143/79 | HR 106 | Temp 97.9°F | Wt 245.6 lb

## 2012-03-20 DIAGNOSIS — C539 Malignant neoplasm of cervix uteri, unspecified: Secondary | ICD-10-CM

## 2012-03-20 HISTORY — DX: Malignant neoplasm of cervix uteri, unspecified: C53.9

## 2012-03-20 NOTE — Telephone Encounter (Signed)
Pt has np appt. With Dr. Darrold Span on 04/03/12@9 :30. Dagmar Hait gave pt her appt. Time and date Referring Dr. Duard Brady Dx-Cervical Ca

## 2012-03-20 NOTE — Progress Notes (Signed)
Patient here for radiation consultation of newly diagnosed cervical cancer.Married has three children ages 53, 64, and 20.Relatively healthy.Denies pain.

## 2012-03-20 NOTE — Progress Notes (Signed)
Please see the Nurse Progress Note in the MD Initial Consult Encounter for this patient. 

## 2012-03-20 NOTE — Progress Notes (Signed)
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 female who is seen out of the courtesy of Dr. Duard Brady for an opinion concerning radiation therapy as part of management of patient's recently diagnosed advanced cervical cancer.  The patient presented with a two-month history of the foul-smelling vaginal discharge.  She presented to the emergency room and was treated for bacterial vaginosis.  This intervention helped somewhat with the odor but she continued to have significant vaginal discharge requiring pads.  The patient was seen by Dr. Debroah Loop and a mass was noted along the cervix area. A biopsy of this area revealed poorly differentiated squamous cell carcinoma.  Staging workup including a CT scan of the chest abdomen and pelvis, as noted below showed probable metastatic lymphadenopathy.  The patient is scheduled for a PET scan later this week to complete her staging workup.  She is not felt to be a surgical candidate in light of the advanced nature of her presentation.  PREVIOUS RADIATION THERAPY: No  PAST MEDICAL HISTORY:  has a past medical history of Cervical cancer.    PAST SURGICAL HISTORY: Past Surgical History  Procedure Date  . Tubal ligation 2006  . Cervical biopsy 02/28/2012    poorly differentiated squamous cell ca    FAMILY HISTORY: family history includes Diabetes in her mother and Hypertension in her mother.  SOCIAL HISTORY:  reports that she has never smoked. She has never used smokeless tobacco. She reports that she drinks alcohol. She reports that she does not use  illicit drugs.  ALLERGIES: Review of patient's allergies indicates no known allergies.  MEDICATIONS:  Current Outpatient Prescriptions  Medication Sig Dispense Refill  . BIOTIN PO Take 1 tablet by mouth 2 (two) times a week.        REVIEW OF SYSTEMS:  A 15 point review of systems is documented in the electronic medical record. This was obtained by the nursing staff. However, I reviewed this with the patient to discuss relevant findings and make appropriate changes.  She is starting to have some pain in the upper pelvis.   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  She denies any active vaginal bleeding. She did have some bleeding after her biopsy. She denies any hematuria or rectal bleeding.   PHYSICAL EXAM:  weight is 245 lb 9.6 oz (111.403 kg). Her temperature is 97.9 F (36.6 C). Her blood pressure is 143/79 and her pulse is 106.   BP 143/79  Pulse 106  Temp 97.9 F (36.6 C)  Wt 245 lb 9.6 oz (111.403 kg)  LMP 03/12/2012  General Appearance:    Alert, cooperative, no distress, appears stated age  Head:    Normocephalic, without obvious abnormality, atraumatic  Eyes:    PERRL, conjunctiva/corneas clear, EOM's intact,   Ears:    Normal TM's and external ear canals, both ears  Nose:   Nares normal, septum midline, mucosa normal, no drainage    or sinus tenderness  Throat:   Lips, mucosa, and tongue normal; teeth and gums normal  Neck:   Supple, symmetrical, trachea midline, no adenopathy;  thyroid:  no enlargement/tenderness/nodules; no carotid   bruit or JVD  Back:     Symmetric, no curvature, ROM normal, no CVA tenderness  Lungs:     Clear to auscultation bilaterally, respirations unlabored  Chest Wall:    No tenderness or deformity   Heart:    Regular rate and rhythm, , no murmur, rub   or gallop     Abdomen:     Soft, non-tender, bowel sounds active all four quadrants,    no masses, no organomegaly  Genitalia:    Normal female without lesion along the  vulvar area, a speculum exam reveals  copious amounts of watery malodorous fluid, no active bleeding, the cervix is replaced by a large necrotic friable tumor,  on bimanual examination this is estimated to be approximate 7-8 cm in size,  there may be parametrial involvement possibly along the right side.  Rectal:    Normal tone, normal prostate, no masses or tenderness;   guaiac negative stool  Extremities:   Extremities normal, atraumatic, no cyanosis or edema  Pulses:   2+ and symmetric all extremities  Skin:   Skin color, texture, turgor normal, no rashes or lesions  Lymph nodes:   Cervical, supraclavicular, and axillary nodes normal  Neurologic:   CNII-XII intact, normal strength, sensation and reflexes    throughout    LABORATORY DATA:  Lab Results  Component Value Date   WBC 9.8 02/22/2012   HGB 10.7* 02/22/2012   HCT 33.0* 02/22/2012   MCV 70.2* 02/22/2012   PLT 270 02/22/2012   Lab Results  Component Value Date   NA 135 02/22/2012   K 3.2* 02/22/2012   CL 100 02/22/2012   CO2 21 02/22/2012   No results found for this basename: ALT, AST, GGT, ALKPHOS, BILITOT     RADIOGRAPHY: Ct Chest W ContrastUs Transvaginal Non-ob  02/23/2012  *RADIOLOGY REPORT*  Clinical Data: Vaginal bleeding.  Mass.  TRANSABDOMINAL AND TRANSVAGINAL ULTRASOUND OF PELVIS Technique:  Both transabdominal and transvaginal ultrasound examinations of the pelvis were performed. Transabdominal technique was performed for global imaging of the pelvis including uterus, ovaries, adnexal regions, and pelvic cul-de-sac.  It was necessary to proceed with endovaginal exam following the transabdominal exam to visualize the uterus and adnexa.  Comparison:  None  Findings:  Uterus: The uterus is of normal size and echotexture, measuring 11.3 x 5.8 x 9.0 cm.  No focal uterine mass is evident.  Endometrium: The endometrium is within normal limits for age 64 mm.  Right ovary:  The right ovary is of normal size and echotexture  measuring 3.3 x 2.1 x 2.0 cm.  Left ovary: The left ovary is of normal size and echotexture measuring 4.3 x 1.9 x 3.1 cm.  A dominant follicle measures 1.8 cm.  Other findings: An irregular heterogeneous mass is present as at the level of the cervix and within the vagina.  This lesion measures 6.7 x 4.8 x 5.1 cm.  Moderate to color Doppler flow is present within the mass.  IMPRESSION:  1.  6.7 cm cervical were vaginal mass is most concerning for a cervical neoplasm. 2.  The uterus and adnexa are otherwise within normal limits.   Original Report Authenticated By: Jamesetta Orleans. MATTERN, M.D.    Ct Abdomen Pelvis W Contrast  03/13/2012  *RADIOLOGY REPORT*  Clinical Data:  Cervical cancer.   Staging workup.  CT CHEST, ABDOMEN AND PELVIS WITH CONTRAST  Technique:  Multidetector CT imaging of the chest, abdomen and pelvis was  performed following the standard protocol during bolus administration of intravenous contrast.  Contrast: OMNIPAQUE IOHEXOL 300 MG/ML  SOLN  Comparison:  02/22/2012  CT CHEST  Findings:  Ill-defined anterior mediastinal tissue likely represents residual thymic tissue given its indistinct margination. Small axillary lymph nodes do not appear pathologically enlarged. No pathologic mediastinal adenopathy noted.  No significant pulmonary nodules.  IMPRESSION:  1.  No findings of malignancy in the thorax.  CT ABDOMEN AND PELVIS  Findings:  The liver, spleen, pancreas, and adrenal glands appear unremarkable.  The gallbladder and biliary system appear unremarkable.  Small scattered retroperitoneal lymph nodes are not pathologically enlarged by size criteria.  Several pericecal lymph nodes are also within normal limits.  Scattered small mesenteric lymph nodes are within normal limits.  Left upper internal iliac lymph node measures 1.7 cm in short axis on image 91 of series 2, slight surrounding stranding.  A similar right sided internal iliac node measures 1.4 cm in short axis on image 94 of  series 2.  Multiple additional pathologically enlarged pelvic lymph nodes include a 1.8 cm short axis right internal iliac node and a 1.4 cm short axis left pelvic sidewall node on image 102 of series 2.  Large cervical mass measures 7 cm transversely.  Poor fat plane definition noted between this mass in the posterior urinary bladder wall.  A nonspecific 2.3 cm hypodense lesion of the left ovary is observed.  The right ovary is somewhat indistinct.  Small bilateral inguinal lymph nodes are not pathologically enlarged by size criteria.  IMPRESSION:  1.  Cervical mass with bilateral internal iliac and left pelvic sidewall pathologic adenopathy.  A definite intact fat plane between the mass in the posterior urinary bladder is difficult to ensure.  The mass does not appear to directly involve the rectum. 2.  Probable low density cystic lesion of the inferior left ovary, probably a follicle or small complex cyst   Original Report Authenticated By: Gaylyn Rong, M.D.       IMPRESSION: At least stage II-B poorly differentiated squamous cell carcinoma the cervix.  Pending results on the patient's PET scan she would be at good candidate for a definitive course of radiation and radiosensitizing chemotherapy. The patient's radiation would include external beam as well as intracavitary brachytherapy treatments. I discussed the overall treatment course side effects and potential toxicities of radiation therapy in this situation with the patient and her husband. She appears to understand and wishes to proceed with planned course of treatment.  PLAN: Simulation and planning on December 2nd or  3rd with treatments to begin the week of December 9 concomitant with radiosensitizing chemotherapy.  I spent 60 minutes minutes face to face with the patient and more than 50% of that time was spent in counseling and/or coordination of care.   ------------------------------------------------    Billie Lade, PhD, MD

## 2012-03-22 ENCOUNTER — Encounter (HOSPITAL_COMMUNITY): Payer: Self-pay

## 2012-03-22 ENCOUNTER — Encounter (HOSPITAL_COMMUNITY)
Admission: RE | Admit: 2012-03-22 | Discharge: 2012-03-22 | Disposition: A | Discharge status: Home / Self Care | Payer: Self-pay | Source: Ambulatory Visit | Attending: Gynecologic Oncology | Admitting: Gynecologic Oncology

## 2012-03-22 DIAGNOSIS — C539 Malignant neoplasm of cervix uteri, unspecified: Secondary | ICD-10-CM | POA: Insufficient documentation

## 2012-03-22 DIAGNOSIS — N134 Hydroureter: Secondary | ICD-10-CM | POA: Insufficient documentation

## 2012-03-22 DIAGNOSIS — C775 Secondary and unspecified malignant neoplasm of intrapelvic lymph nodes: Secondary | ICD-10-CM | POA: Insufficient documentation

## 2012-03-22 DIAGNOSIS — C772 Secondary and unspecified malignant neoplasm of intra-abdominal lymph nodes: Secondary | ICD-10-CM | POA: Insufficient documentation

## 2012-03-22 LAB — GLUCOSE, CAPILLARY: Glucose-Capillary: 78 mg/dL (ref 70–99)

## 2012-03-22 MED ORDER — FLUDEOXYGLUCOSE F - 18 (FDG) INJECTION
18.9000 | Freq: Once | INTRAVENOUS | Status: AC | PRN
  Administered 2012-03-22: 18.9 via INTRAVENOUS

## 2012-03-27 ENCOUNTER — Telehealth: Payer: Self-pay | Admitting: Radiation Oncology

## 2012-03-27 ENCOUNTER — Ambulatory Visit
Admission: RE | Admit: 2012-03-27 | Discharge: 2012-03-27 | Disposition: A | Discharge status: Home / Self Care | Payer: Medicaid Other | Source: Ambulatory Visit | Attending: Radiation Oncology | Admitting: Radiation Oncology

## 2012-03-27 VITALS — BP 136/73 | HR 110 | Temp 98.5°F | Resp 20 | Wt 233.3 lb

## 2012-03-27 DIAGNOSIS — N898 Other specified noninflammatory disorders of vagina: Secondary | ICD-10-CM | POA: Insufficient documentation

## 2012-03-27 DIAGNOSIS — Z510 Encounter for antineoplastic radiation therapy: Secondary | ICD-10-CM | POA: Insufficient documentation

## 2012-03-27 DIAGNOSIS — C539 Malignant neoplasm of cervix uteri, unspecified: Secondary | ICD-10-CM

## 2012-03-27 DIAGNOSIS — A0472 Enterocolitis due to Clostridium difficile, not specified as recurrent: Secondary | ICD-10-CM | POA: Insufficient documentation

## 2012-03-27 DIAGNOSIS — Z79899 Other long term (current) drug therapy: Secondary | ICD-10-CM | POA: Insufficient documentation

## 2012-03-27 NOTE — Progress Notes (Signed)
IV removed from left antecubital without difficulty.Site clean, dry and intact.Sterile 2 x2 applied and patient instructed to remove in 20 to 30 minutes.Discharged from radiation oncology.

## 2012-03-27 NOTE — Telephone Encounter (Signed)
Met with pt to discuss RO billing. Pt has No Insurance and has applied for MCD with [place] [employer] last week. Pt has also applied for EPP and will bring bank statement back for completion. ACS referral will be done, as well as, pt is not working at this time. However, pt did  provide her husband's wages and they have a 5 person(s) household with income apprx 35,000 yearly.    Dx: 180.9 Cervical  Attending Rad:  JK  Rad Tx: IMRT x40

## 2012-03-27 NOTE — Progress Notes (Signed)
Patient here for iv start for ct simulation and radiation planning.Size 22 g 1" needle used to access left arm antecube without difficulty.Good blood return.Patient isn't a diabetic and has nkda.bun and creatine within normal range.

## 2012-03-29 MED ORDER — SODIUM CHLORIDE 0.9 % IJ SOLN
10.0000 mL | Freq: Once | INTRAMUSCULAR | Status: AC
  Administered 2012-03-29: 10 mL

## 2012-03-29 NOTE — Addendum Note (Signed)
Encounter addended by: Tessa Lerner, RN on: 03/29/2012  3:24 PM<BR>     Documentation filed: Inpatient MAR

## 2012-03-29 NOTE — Addendum Note (Signed)
Encounter addended by: Tessa Lerner, RN on: 03/29/2012  3:21 PM<BR>     Documentation filed: Orders

## 2012-03-29 NOTE — Progress Notes (Signed)
  Radiation Oncology         [phone] ________________________________  Name: Peggy Pitts MRN: [id number]  Date: 03/27/2012  DOB: October 29, 1980  SIMULATION AND TREATMENT PLANNING NOTE  DIAGNOSIS:  Clinical stage II-B squamous cell carcinoma the cervix  NARRATIVE:  The patient was brought to the CT Simulation planning suite.  Identity was confirmed.  All relevant records and images related to the planned course of therapy were reviewed.  The patient freely provided informed written consent to proceed with treatment after reviewing the details related to the planned course of therapy. The consent form was witnessed and verified by the simulation staff.  Then, the patient was set-up in a stable reproducible  supine position for radiation therapy.  CT images were obtained.  Surface markings were placed.  The CT images were loaded into the planning software.  Then the target and avoidance structures were contoured.  Treatment planning then occurred.  The radiation prescription was entered and confirmed.  A total of 1 complex treatment devices were fabricated. I have requested : Intensity Modulated Radiotherapy (IMRT) is medically necessary for this case for the following reason:  Small bowel sparing..  I have ordered: dose calc. PLAN:  The patient will receive 45.0 Gy in 25 fractions directed at the pelvis and periaortic area.  The patient will also receive 5 intracavitary brachytherapy treatments. She will receive radiosensitizing chemotherapy during her external beam treatments.  ________________________________    Billie Lade, PhD, MD

## 2012-03-31 ENCOUNTER — Other Ambulatory Visit: Payer: Self-pay | Admitting: Oncology

## 2012-03-31 DIAGNOSIS — C539 Malignant neoplasm of cervix uteri, unspecified: Secondary | ICD-10-CM

## 2012-04-03 ENCOUNTER — Other Ambulatory Visit: Payer: Self-pay | Admitting: Lab

## 2012-04-03 ENCOUNTER — Encounter: Payer: Self-pay | Admitting: Oncology

## 2012-04-03 ENCOUNTER — Other Ambulatory Visit (HOSPITAL_BASED_OUTPATIENT_CLINIC_OR_DEPARTMENT_OTHER): Payer: Self-pay | Admitting: Lab

## 2012-04-03 ENCOUNTER — Telehealth: Payer: Self-pay | Admitting: Oncology

## 2012-04-03 ENCOUNTER — Ambulatory Visit: Payer: Self-pay

## 2012-04-03 ENCOUNTER — Ambulatory Visit (HOSPITAL_BASED_OUTPATIENT_CLINIC_OR_DEPARTMENT_OTHER): Payer: Self-pay | Admitting: Oncology

## 2012-04-03 ENCOUNTER — Telehealth: Payer: Self-pay | Admitting: *Deleted

## 2012-04-03 VITALS — BP 134/80 | HR 97 | Temp 97.6°F | Resp 18 | Ht 66.0 in | Wt 245.5 lb

## 2012-04-03 DIAGNOSIS — Z23 Encounter for immunization: Secondary | ICD-10-CM

## 2012-04-03 DIAGNOSIS — C539 Malignant neoplasm of cervix uteri, unspecified: Secondary | ICD-10-CM

## 2012-04-03 DIAGNOSIS — D500 Iron deficiency anemia secondary to blood loss (chronic): Secondary | ICD-10-CM

## 2012-04-03 LAB — CBC WITH DIFFERENTIAL/PLATELET
BASO%: 0.6 % (ref 0.0–2.0)
EOS%: 24 % — ABNORMAL HIGH (ref 0.0–7.0)
HCT: 29.7 % — ABNORMAL LOW (ref 34.8–46.6)
LYMPH%: 17.6 % (ref 14.0–49.7)
MCH: 21.9 pg — ABNORMAL LOW (ref 25.1–34.0)
MCHC: 31.6 g/dL (ref 31.5–36.0)
NEUT%: 52.9 % (ref 38.4–76.8)
Platelets: 338 10*3/uL (ref 145–400)
RBC: 4.29 10*6/uL (ref 3.70–5.45)
lymph#: 2.2 10*3/uL (ref 0.9–3.3)
nRBC: 0 % (ref 0–0)

## 2012-04-03 LAB — COMPREHENSIVE METABOLIC PANEL (CC13)
AST: 14 U/L (ref 5–34)
Alkaline Phosphatase: 70 U/L (ref 40–150)
BUN: 8 mg/dL (ref 7.0–26.0)
Creatinine: 0.8 mg/dL (ref 0.6–1.1)

## 2012-04-03 MED ORDER — INFLUENZA VIRUS VACC SPLIT PF IM SUSP
0.5000 mL | Freq: Once | INTRAMUSCULAR | Status: AC
  Administered 2012-04-03: 0.5 mL via INTRAMUSCULAR
  Filled 2012-04-03: qty 0.5

## 2012-04-03 NOTE — Progress Notes (Signed)
Checked in new patient. She has no insurance but has applied for [employer] and financial assistance with [employer]. I will advised Dr. Darrold Span patient is uninsured.

## 2012-04-03 NOTE — Progress Notes (Signed)
[facility] NEW PATIENT EVALUATION   Name: Peggy Pitts Date: 04/03/2012 MRN: [id number] DOB: 12-Aug-1980  REFERRING PHYSICIAN: Cleda Mccreedy, MD [facility]  Scheryl Darter, MD  REASON FOR REFERRAL: cervical cancer, for sensitizing chemotherapy with radiation therapy.    HISTORY OF PRESENT ILLNESS:Peggy Pitts is a 31 y.o. female who is seen, alone for visit today, in new patient consultation at the request of Dr Cleda Mccreedy because of recently diagnosed poorly differentiated squamous cell carcinoma of cervix, for consideration of sensitizing chemotherapy with radiation therapy. She has met with Dr Roselind Messier and is scheduled to begin RT on 04-05-12.  Patient presented to ED 01-11-12 with complaints of foul vaginal odor x 2 weeks, cultures negative for chlamydia and GC and treatment given with rocephin and zithromax. She returned to ED on 02-22-12 with ongoing problem, with finding of large mass obstructing cervical os. She was referred to Dr Scheryl Darter, with hx of last PAP several years previously; biopsy of the cervical mass showed invasive poorly differentiated squamous cell carcinoma (path [id number]). She had transabdominal and transvaginal US during this evaluation. CT CAP 03-13-12 had probable residual thymic tissue in mediastinum, 7 cm cervical mass with poor fat plane between this and posterior bladder, bilateral internal iliac and left pelvic sidewall adenopathy. She saw Dr Duard Brady on 03-15-12, clinical stage IIB. She was then seen by Dr Roselind Messier and had PET 03-22-12, wih uptake in retroperitoneal and pelvic nodes and  the cervical mass; she also had mild fullness of right renal collecting system. Dr Roselind Messier plans RT in 25 fractions to pelvis and periaortics, presently set up 04-05-12 thru 05-11-12. She has not attended chemotherapy teaching class yet.   REVIEW OF SYSTEMS: No headaches or other neurologic symptoms. No hearing problems, no visual problems. Last dental exam ~ 1.5 yrs ago,  no known dental problems. No changes in breasts, has never had mammograms (31 yo). No cardiac symptoms. Usual weight 240 lbs. Appetite decreased recently due to anxiety with diagnosis. Bowels move ~ qod as usual. No bladder symptoms. Has had light bleeding, may have had menstrual period last week (?). Has had very bothersome vaginal odor. Has had BTL, states not sexually active. No arthritis. No other bleeding. No LE swelling. Some NP cough today without shortness of breath. Some pain LLQ after stands at work all day, improved with aleve.  ALLERGIES: Review of patient's allergies indicates no known allergies.  PAST MEDICAL HISTORY:  G3P3 BTL   Never transfused  CURRENT MEDICATIONS: We administered influenza  inactive virus vaccine.    SOCIAL HISTORY:  reports that she has never smoked. She has never used smokeless tobacco. She reports that she drinks alcohol. She reports that she does not use illicit drugs. ETOH use is ~ 2 drinks per year. 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 Children are son age 28 in 9th grade, daughter age 51 in 49th grade and son age 56 in 2nd grade. She does not have insurance coverage; [facility] financial staff is aware and assisting.  FAMILY HISTORY: family history includes Diabetes in her mother and Hypertension in her mother. Mother died with complications of DM and HTN Sister had hysterectomy for some malignancy Children healthy  LABORATORY DATA:  Results for orders placed in visit on 04/03/12 (from the past 48 hour(s))  CBC WITH DIFFERENTIAL     Status: Abnormal   Collection Time   04/03/12 10:17 AM      Component Value Range Comment  WBC 12.3 (*) 3.9 - 10.3 10e3/uL    NEUT# 6.5  1.5 - 6.5 10e3/uL    HGB 9.4 (*) 11.6 - 15.9 g/dL    HCT 78.2 (*) 95.6 - 46.6 %    Platelets 338  145 - 400 10e3/uL    MCV 69.2 (*) 79.5 - 101.0 fL    MCH 21.9 (*) 25.1 - 34.0 pg    MCHC 31.6  31.5 - 36.0 g/dL    RBC 2.13  0.86 - 5.78 10e6/uL     RDW 18.2 (*) 11.2 - 14.5 %    lymph# 2.2  0.9 - 3.3 10e3/uL    MONO# 0.6  0.1 - 0.9 10e3/uL    Eosinophils Absolute 2.9 (*) 0.0 - 0.5 10e3/uL    Basophils Absolute 0.1  0.0 - 0.1 10e3/uL    NEUT% 52.9  38.4 - 76.8 %    LYMPH% 17.6  14.0 - 49.7 %    MONO% 4.9  0.0 - 14.0 %    EOS% 24.0 (*) 0.0 - 7.0 %    BASO% 0.6  0.0 - 2.0 %    nRBC 0  0 - 0 %      CMET and Mg available after visit have K 3.3, glu 101, alb 3.4 and Mg 2.0  RADIOGRAPHY:  CT CAP 03-13-12 Comparison: 02/22/2012  CT CHEST  Findings: Ill-defined anterior mediastinal tissue likely  represents residual thymic tissue given its indistinct margination.  Small axillary lymph nodes do not appear pathologically enlarged.  No pathologic mediastinal adenopathy noted.  No significant pulmonary nodules.  IMPRESSION:  1. No findings of malignancy in the thorax.  CT ABDOMEN AND PELVIS  Findings: The liver, spleen, pancreas, and adrenal glands appear  unremarkable.  The gallbladder and biliary system appear unremarkable.  Small scattered retroperitoneal lymph nodes are not pathologically  enlarged by size criteria. Several pericecal lymph nodes are also  within normal limits. Scattered small mesenteric lymph nodes are  within normal limits.  Left upper internal iliac lymph node measures 1.7 cm in short axis  on image 91 of series 2, slight surrounding stranding. A similar  right sided internal iliac node measures 1.4 cm in short axis on  image 94 of series 2. Multiple additional pathologically enlarged  pelvic lymph nodes include a 1.8 cm short axis right internal iliac  node and a 1.4 cm short axis left pelvic sidewall node on image 102  of series 2.  Large cervical mass measures 7 cm transversely. Poor fat plane  definition noted between this mass in the posterior urinary bladder  wall. A nonspecific 2.3 cm hypodense lesion of the left ovary is  observed. The right ovary is somewhat indistinct.  Small bilateral inguinal  lymph nodes are not pathologically  enlarged by size criteria.  IMPRESSION:  1. Cervical mass with bilateral internal iliac and left pelvic  sidewall pathologic adenopathy. A definite intact fat plane  between the mass in the posterior urinary bladder is difficult to  ensure. The mass does not appear to directly involve the rectum.  2. Probable low density cystic lesion of the inferior left ovary,  probably a follicle or small complex cyst      NUCLEAR MEDICINE PET SKULL BASE TO THIGH 03-22-12 Fasting Blood Glucose: 78  Technique: 18.9 mCi F-18 FDG was injected intravenously. CT data  was obtained and used for attenuation correction and anatomic  localization only. (This was not acquired as a diagnostic CT  examination.) Additional exam technical data entered on  technologist worksheet.  Comparison: CT chest abdomen pelvis dated 03/13/2012  Findings:  Neck: No hypermetabolic lymph nodes in the neck.  Chest: No hypermetabolic mediastinal or hilar nodes. No  suspicious pulmonary nodules on the CT scan.  Abdomen/Pelvis: Cervical mass, corresponding to known cervical  cancer, max SUV 37.4.  Retroperitoneal/pelvic lymphadenopathy, including:  --5 mm short-axis aortocaval node (series 2/image 153), max SUV 4.8  --1.5 cm short-axis left common iliac node (series 2/image 173),  max SUV 14.0  --1.4 cm short-axis right common iliac node (series 2/image 176),  max SUV 16.1  --Right internal iliac node (series 2/image 23), better visualized  on prior CT  --1.5 cm short-axis left pelvic sidewall node (series 2/image 27),  max SUV 14.8  No abnormal hypermetabolic activity within the liver, pancreas,  adrenal glands, or spleen.  Very mild fullness of the right renal collecting system with  associated mild hydroureter. This appearance is worrisome for  distal ureteral obstruction/involvement by tumor.  Skeleton: Heterogeneous osseous uptake. No focal hypermetabolic  activity to suggest  skeletal metastasis.  IMPRESSION:  Cervical mass, corresponding to known cervical cancer, max SUV  37.4.  Associated retroperitoneal/pelvic nodal metastases, as described  above.  Mild fullness of the right renal collecting system with associated  mild right hydroureter, worrisome for distal ureteral  obstruction/involvement by tumor.    Medications:   We will send in prescriptions for antiemetics and will request OTC ferrous gluconate or ferrous fumarate, possibly to [facility] if she qualifies for assistance from [facility].     PHYSICAL EXAM:  height is 5\' 6"  (1.676 m) and weight is 245 lb 8 oz (111.358 kg). Her oral temperature is 97.6 F (36.4 C). Her blood pressure is 134/80 and her pulse is 97. Her respiration is 18.   Very pleasant AA lady looks stated age, obese. HEENT: PERRL, not icteric, oral mucosa clear, good dentition, neck supple without JVD or thyroid mass. Lymphatics: no cervical, supraclavicular, axillary, inguinal adenopathy. Lungs clear to auscultation and percussion. Back not tender to palpation. Heart RRR, no murmur or gallop. Breasts bilaterally without dominant mass, skin or nipple findings. Abdomen soft, nontender, normal bowel sounds, no appreciable organomegaly or mass. LE no edema, cords, tenderness. Skin without rash or ecchymosis. Neuro: CN, motor, sensory, cerebellar nonfocal. Vaginal odor present.     Patient was seen also by gyn oncology RN with some suggestions for managing the vaginal odor.   We have discussed all of history as above and findings on CTs/ PET, as well as recommendation for radiation with sensitizing cisplatin. We have discussed this  chemotherapy, including schedule of treatments,necessary hydration and possible side effects including nausea, cytopenias, fatigue, hair loss; she will attend teaching class prior to treatment. Patient seems to follow the discussion well and is in agreement with plan.   IMPRESSION / PLAN:  1.Squamous  cell carcinoma of cervix: plan RT with sensitizing chemotherapy. Will start chemotherapy on 04-10-12. 2.anemia related to gyn bleeding: will begin oral iron as above. May need IV iron or transfusion to keep in adequate range for RT 3.BTL 4.flu vaccine given today  She will have CDDP on 12-16, 12-23, 12-30, 1-6 and 1-13. She will have chemistries and Mg++ every Friday and CBC day of each Rx. I will see her again on 12-20 or sooner if needed; she understands that she can call at any time if questions or concerns.  Asjah Rauda P, MD 04/03/2012 11:15 AM

## 2012-04-03 NOTE — Telephone Encounter (Signed)
Per staff phone call and POF I have scheduled appts. JMW  

## 2012-04-03 NOTE — Telephone Encounter (Signed)
appts made and printed for pt aom °

## 2012-04-03 NOTE — Patient Instructions (Addendum)
You can fax FMLA paperwork to Dr Darrold Span [phone] or bring it to the office and tell front receptionist that it is for Dr Darrold Span.  Flu shot given today  Need to start iron tablets, ~ 324 mg daily.  No slow release types because these are too hard to absorb.  Ferrous sulfate is most common type but sometimes upsets stomach more; ferrous gluconate or ferrous fumarate are also over the counter and sometimes easier on stomach.

## 2012-04-04 ENCOUNTER — Encounter: Payer: Self-pay | Admitting: *Deleted

## 2012-04-04 ENCOUNTER — Other Ambulatory Visit: Payer: Self-pay

## 2012-04-04 ENCOUNTER — Telehealth: Payer: Self-pay | Admitting: Radiation Oncology

## 2012-04-04 DIAGNOSIS — C539 Malignant neoplasm of cervix uteri, unspecified: Secondary | ICD-10-CM

## 2012-04-04 MED ORDER — FERROUS GLUCONATE 324 (38 FE) MG PO TABS: ORAL_TABLET | ORAL | Status: DC

## 2012-04-04 MED ORDER — ONDANSETRON HCL 8 MG PO TABS: 8.0000 mg | ORAL_TABLET | Freq: Three times a day (TID) | ORAL | Status: DC | PRN

## 2012-04-04 MED ORDER — LORAZEPAM 1 MG PO TABS: ORAL_TABLET | ORAL | Status: DC

## 2012-04-04 NOTE — Progress Notes (Signed)
Left message in voice mail in cell phone that prescriptions called to [facility] pharmacy and that she had funds available to cover prescriptions.

## 2012-04-04 NOTE — Telephone Encounter (Signed)
**  CORRECTION**   INDIGENT APPROVED 100%  FAMILY SIZE: 5  HH INC: 30,735.00  MOD POV: 27,570.00-34,462.50 VALID DATES: 04/04/2012-10/03/2012  100% INDIGENT - PLEASE APPLY DISCOUNT TO ANY 6 MONTHS PRIOR AND ALL CURRENT BILL.

## 2012-04-04 NOTE — Telephone Encounter (Signed)
INDIGENT APPROVED 100% FAMILY SIZE: 5 HH INC: 30,735.00 MOD POV: 11,490.00 VALID DATES: 04/04/2012-10/03/2012 100% INDIGENT -  PLEASE APPLY DISCOUNT TO ANY  6 MONTHS PRIOR AND ALL CURRENT BILL.   [facility] $400

## 2012-04-05 ENCOUNTER — Ambulatory Visit
Admission: RE | Admit: 2012-04-05 | Discharge: 2012-04-05 | Disposition: A | Discharge status: Home / Self Care | Payer: Medicaid Other | Source: Ambulatory Visit | Attending: Radiation Oncology | Admitting: Radiation Oncology

## 2012-04-05 ENCOUNTER — Ambulatory Visit: Admission: RE | Admit: 2012-04-05 | Payer: Medicaid Other | Source: Ambulatory Visit | Admitting: Radiation Oncology

## 2012-04-05 DIAGNOSIS — C539 Malignant neoplasm of cervix uteri, unspecified: Secondary | ICD-10-CM

## 2012-04-06 ENCOUNTER — Ambulatory Visit
Admission: RE | Admit: 2012-04-06 | Discharge: 2012-04-06 | Disposition: A | Discharge status: Home / Self Care | Payer: Medicaid Other | Source: Ambulatory Visit | Attending: Radiation Oncology | Admitting: Radiation Oncology

## 2012-04-06 DIAGNOSIS — C539 Malignant neoplasm of cervix uteri, unspecified: Secondary | ICD-10-CM

## 2012-04-06 MED ORDER — BIAFINE EX EMUL
CUTANEOUS | Status: DC | PRN
  Administered 2012-04-06: 1 via TOPICAL

## 2012-04-06 NOTE — Progress Notes (Signed)
   Department of Radiation Oncology  04/05/2012  Phone:  [phone] Fax:        [phone]   Intensity modulated radiation therapy device note  Today the patient began radiation therapy directed at the pelvis and periaortic area. Patient will be treated with helical intensity modulated radiation therapy.   patient had development of 8.2 sinogram segments. This constitutes 1 intensity modulated radiation therapy device.  -----------------------------------  Billie Lade, PhD, MD

## 2012-04-06 NOTE — Progress Notes (Signed)
Routine of clinic reviewed with patient.Explained side effects of treatment and how to take care of self.Given Radiation Therapy and You Booklet and biafine to apply when skin changes arise in next 2 to three weeks.Informed to push water intake to prevent bladder infection and get imodium ad In case diarrhea occurs.Informed of weekly doctor assessment but may be seen during treatment if problems occur.

## 2012-04-07 ENCOUNTER — Ambulatory Visit (HOSPITAL_BASED_OUTPATIENT_CLINIC_OR_DEPARTMENT_OTHER)
Admission: RE | Admit: 2012-04-07 | Discharge: 2012-04-07 | Disposition: A | Discharge status: Home / Self Care | Payer: Medicaid Other | Source: Ambulatory Visit | Attending: Radiation Oncology | Admitting: Radiation Oncology

## 2012-04-07 NOTE — Progress Notes (Signed)
Patient brought to nursing after radiation treatment with questions regarding nausea and vomiting.Stomach upset and difficulty keeping down po intake.Instructed to take zofran every morning prior to treatment as radiation may cause nausea being we are treating periaortics.To try ativan at bedtime as patient does work during the day.

## 2012-04-10 ENCOUNTER — Other Ambulatory Visit: Payer: Self-pay | Admitting: Lab

## 2012-04-10 ENCOUNTER — Ambulatory Visit (HOSPITAL_BASED_OUTPATIENT_CLINIC_OR_DEPARTMENT_OTHER): Payer: Self-pay

## 2012-04-10 ENCOUNTER — Ambulatory Visit
Admission: RE | Admit: 2012-04-10 | Discharge: 2012-04-10 | Disposition: A | Discharge status: Home / Self Care | Payer: Medicaid Other | Source: Ambulatory Visit | Attending: Radiation Oncology | Admitting: Radiation Oncology

## 2012-04-10 VITALS — BP 141/78 | HR 104 | Temp 99.0°F | Wt 236.2 lb

## 2012-04-10 VITALS — BP 119/68 | HR 102 | Temp 99.9°F | Resp 18

## 2012-04-10 DIAGNOSIS — C539 Malignant neoplasm of cervix uteri, unspecified: Secondary | ICD-10-CM

## 2012-04-10 DIAGNOSIS — Z5111 Encounter for antineoplastic chemotherapy: Secondary | ICD-10-CM

## 2012-04-10 LAB — CBC WITH DIFFERENTIAL/PLATELET
Basophils Absolute: 0.1 10*3/uL (ref 0.0–0.1)
Eosinophils Absolute: 0.7 10*3/uL — ABNORMAL HIGH (ref 0.0–0.5)
HCT: 30 % — ABNORMAL LOW (ref 34.8–46.6)
HGB: 9.6 g/dL — ABNORMAL LOW (ref 11.6–15.9)
LYMPH%: 7.2 % — ABNORMAL LOW (ref 14.0–49.7)
MCV: 68.8 fL — ABNORMAL LOW (ref 79.5–101.0)
MONO%: 5.7 % (ref 0.0–14.0)
NEUT#: 8.8 10*3/uL — ABNORMAL HIGH (ref 1.5–6.5)
NEUT%: 80.1 % — ABNORMAL HIGH (ref 38.4–76.8)
Platelets: 299 10*3/uL (ref 145–400)

## 2012-04-10 MED ORDER — DEXAMETHASONE SODIUM PHOSPHATE 4 MG/ML IJ SOLN
20.0000 mg | Freq: Once | INTRAMUSCULAR | Status: AC
  Administered 2012-04-10: 20 mg via INTRAVENOUS

## 2012-04-10 MED ORDER — SODIUM CHLORIDE 0.9 % IV SOLN
150.0000 mg | Freq: Once | INTRAVENOUS | Status: AC
  Administered 2012-04-10: 150 mg via INTRAVENOUS
  Filled 2012-04-10: qty 5

## 2012-04-10 MED ORDER — SODIUM CHLORIDE 0.9 % IV SOLN
39.4000 mg/m2 | Freq: Once | INTRAVENOUS | Status: AC
  Administered 2012-04-10: 90 mg via INTRAVENOUS
  Filled 2012-04-10: qty 90

## 2012-04-10 MED ORDER — ONDANSETRON 16 MG/50ML IVPB (CHCC)
16.0000 mg | Freq: Once | INTRAVENOUS | Status: AC
  Administered 2012-04-10: 16 mg via INTRAVENOUS

## 2012-04-10 MED ORDER — SODIUM CHLORIDE 0.9 % IV SOLN
Freq: Once | INTRAVENOUS | Status: AC
Start: 2012-04-10 — End: 2012-04-10
  Administered 2012-04-10: 09:00:00 via INTRAVENOUS

## 2012-04-10 MED ORDER — POTASSIUM CHLORIDE 2 MEQ/ML IV SOLN
Freq: Once | INTRAVENOUS | Status: AC
  Administered 2012-04-10: 09:00:00 via INTRAVENOUS
  Filled 2012-04-10: qty 10

## 2012-04-10 NOTE — Progress Notes (Signed)
[facility]    Radiation Oncology [address] [place]     Maryln Gottron, M.D. [place], [place] [address]               Billie Lade, M.D., Ph.D. Phone: [phone]      Molli Hazard A. Kathrynn Running, M.D. Fax: [phone]      Radene Gunning, M.D., Ph.D.         Lurline Hare, M.D.         Grayland Jack, M.D Weekly Treatment Management Note  Name: Peggy Pitts     MRN: [id number]        CSN: [id number] Date: 04/10/2012      DOB: [birth year]  CC: Peggy Oats, MD         Default    Status: Outpatient  Diagnosis: The encounter diagnosis was Cervical cancer.  Current Dose: 7.2  Current Fraction: 4  Planned Dose: 45.0 Gy  Narrative: Peggy Pitts was seen today for weekly treatment management. The chart was checked and MVCT  were reviewed.  Since last Thursday the patient has had significant nausea.  She did have prescriptions for Zofran and Ativan as of last week. I am unsure whether she used these prior to her treatment. I have instructed her to take Ativan or Zofran 1 hour prior to her radiation therapy in light of her significant nausea. She has had some increased flatus and some loose bowels but no significant diarrhea at this time. She continues to have a significant vaginal discharge but no vaginal bleeding. She is using approximately 5-6 pads per day in light of her drainage.   Review of patient's allergies indicates no known allergies.  Current Outpatient Prescriptions  Medication Sig Dispense Refill  . BIOTIN PO Take 1 tablet by mouth 2 (two) times a week.      . ferrous gluconate (FERGON) 324 MG tablet Take daily on empty stomach with OJ  30 tablet  4  . LORazepam (ATIVAN) 1 MG tablet Take 0.5 to 1 mg under tongue or swallow every 6 hours as needed for nausea.  Will make drowsy.  30 tablet  0  . naproxen sodium (ANAPROX) 220 MG tablet Take 220 mg by mouth 2 (two) times daily as needed.      . ondansetron (ZOFRAN) 8 MG tablet Take 1-2 tablets (8-16 mg  total) by mouth every 8 (eight) hours as needed for nausea (Non drowsy).  30 tablet  1   Labs:  Lab Results  Component Value Date   WBC 11.0* 04/10/2012   HGB 9.6* 04/10/2012   HCT 30.0* 04/10/2012   MCV 68.8* 04/10/2012   PLT 299 04/10/2012   Lab Results  Component Value Date   CREATININE 0.8 04/03/2012   BUN 8.0 04/03/2012   NA 141 04/03/2012   K 3.3* 04/03/2012   CL 102 04/03/2012   CO2 27 04/03/2012   Lab Results  Component Value Date   ALT 14 04/03/2012   AST 14 04/03/2012   BILITOT 0.31 04/03/2012    Physical Examination:  weight is 236 lb 3.2 oz (107.14 kg). Her temperature is 99 F (37.2 C). Her blood pressure is 141/78 and her pulse is 104.    Wt Readings from Last 3 Encounters:  04/10/12 236 lb 3.2 oz (107.14 kg)  04/03/12 245 lb 8 oz (111.358 kg)  03/27/12 233 lb 4.8 oz (105.824 kg)     Lungs - Normal respiratory effort, chest expands symmetrically. Lungs are clear to auscultation,  no crackles or wheezes.  Heart has regular rhythm and rate  Abdomen is soft and non tender with normal bowel sounds  Assessment:  Patient tolerating treatments well except for issues as above   Plan: Continue treatment per original radiation prescription. She will began her radiosensitizing chemotherapy this week.

## 2012-04-10 NOTE — Progress Notes (Signed)
Here for routine weekly under treat assessment of pelvic radiation.Had first chemotherapy treatment today.Nausea improved but still having difficulty eating as causes nausea.Has abdominal cramping.Bowels are soft and regular.

## 2012-04-10 NOTE — Patient Instructions (Addendum)
[facility] Discharge Instructions for Patients Receiving Chemotherapy  Today you received the following chemotherapy agents Cisplatin  To help prevent nausea and vomiting after your treatment, we encourage you to take your nausea medication as directed by Dr Darrold Span.   If you develop nausea and vomiting that is not controlled by your nausea medication, call the clinic. If it is after clinic hours your family physician or the after hours number for the clinic or go to the Emergency Department.   BELOW ARE SYMPTOMS THAT SHOULD BE REPORTED IMMEDIATELY:  *FEVER GREATER THAN 100.5 F  *CHILLS WITH OR WITHOUT FEVER  NAUSEA AND VOMITING THAT IS NOT CONTROLLED WITH YOUR NAUSEA MEDICATION  *UNUSUAL SHORTNESS OF BREATH  *UNUSUAL BRUISING OR BLEEDING  TENDERNESS IN MOUTH AND THROAT WITH OR WITHOUT PRESENCE OF ULCERS  *URINARY PROBLEMS  *BOWEL PROBLEMS  UNUSUAL RASH Items with * indicate a potential emergency and should be followed up as soon as possible.  One of the nurses will contact you 24 hours after your treatment. Please let the nurse know about any problems that you may have experienced. Feel free to call the clinic you have any questions or concerns. The clinic phone number is [phone].   I have been informed and understand all the instructions given to me. I know to contact the clinic, my physician, or go to the Emergency Department if any problems should occur. I do not have any questions at this time, but understand that I may call the clinic during office hours   should I have any questions or need assistance in obtaining follow up care.    __________________________________________  _____________  __________ Signature of Patient or Authorized Representative            Date                   Time    __________________________________________ Nurse's Signature

## 2012-04-11 ENCOUNTER — Telehealth: Payer: Self-pay | Admitting: Oncology

## 2012-04-11 ENCOUNTER — Telehealth: Payer: Self-pay | Admitting: *Deleted

## 2012-04-11 ENCOUNTER — Ambulatory Visit
Admission: RE | Admit: 2012-04-11 | Discharge: 2012-04-11 | Disposition: A | Discharge status: Home / Self Care | Payer: Medicaid Other | Source: Ambulatory Visit | Attending: Radiation Oncology | Admitting: Radiation Oncology

## 2012-04-11 NOTE — Telephone Encounter (Signed)
CHECKING TO SEE HOW PT. WAS DOING. INSTRUCTED HER TO CALL THIS OFFICE TO TALK WITH THE ON CALL PHYSICIAN IF SHE HAS ANY PROBLEMS.

## 2012-04-11 NOTE — Telephone Encounter (Signed)
Added lb appts for 12/30, 1/6, and 1/13. S/w pt and she will get new schedule at next appt 12/20.

## 2012-04-12 ENCOUNTER — Telehealth: Payer: Self-pay | Admitting: Oncology

## 2012-04-12 ENCOUNTER — Ambulatory Visit
Admission: RE | Admit: 2012-04-12 | Discharge: 2012-04-12 | Disposition: A | Discharge status: Home / Self Care | Payer: Medicaid Other | Source: Ambulatory Visit | Attending: Radiation Oncology | Admitting: Radiation Oncology

## 2012-04-12 NOTE — Telephone Encounter (Signed)
Pt came into office and picked up Dec schedule.....Marland Kitchenschedule already completed

## 2012-04-13 ENCOUNTER — Ambulatory Visit
Admission: RE | Admit: 2012-04-13 | Discharge: 2012-04-13 | Disposition: A | Discharge status: Home / Self Care | Payer: Medicaid Other | Source: Ambulatory Visit | Attending: Radiation Oncology | Admitting: Radiation Oncology

## 2012-04-14 ENCOUNTER — Other Ambulatory Visit: Payer: Self-pay

## 2012-04-14 ENCOUNTER — Ambulatory Visit
Admission: RE | Admit: 2012-04-14 | Discharge: 2012-04-14 | Disposition: A | Discharge status: Home / Self Care | Payer: Medicaid Other | Source: Ambulatory Visit | Attending: Radiation Oncology | Admitting: Radiation Oncology

## 2012-04-14 ENCOUNTER — Encounter: Payer: Self-pay | Admitting: Oncology

## 2012-04-14 ENCOUNTER — Ambulatory Visit (HOSPITAL_BASED_OUTPATIENT_CLINIC_OR_DEPARTMENT_OTHER): Payer: Self-pay | Admitting: Oncology

## 2012-04-14 ENCOUNTER — Other Ambulatory Visit (HOSPITAL_BASED_OUTPATIENT_CLINIC_OR_DEPARTMENT_OTHER): Payer: Self-pay | Admitting: Lab

## 2012-04-14 VITALS — BP 110/65 | HR 110 | Temp 98.9°F | Resp 20 | Ht 66.0 in | Wt 233.8 lb

## 2012-04-14 DIAGNOSIS — C539 Malignant neoplasm of cervix uteri, unspecified: Secondary | ICD-10-CM

## 2012-04-14 DIAGNOSIS — D649 Anemia, unspecified: Secondary | ICD-10-CM

## 2012-04-14 LAB — COMPREHENSIVE METABOLIC PANEL (CC13)
CO2: 26 mEq/L (ref 22–29)
Creatinine: 1.1 mg/dL (ref 0.6–1.1)
Glucose: 104 mg/dl — ABNORMAL HIGH (ref 70–99)
Sodium: 132 mEq/L — ABNORMAL LOW (ref 136–145)
Total Bilirubin: 0.43 mg/dL (ref 0.20–1.20)
Total Protein: 7.1 g/dL (ref 6.4–8.3)

## 2012-04-14 LAB — MAGNESIUM (CC13): Magnesium: 2 mg/dl (ref 1.5–2.5)

## 2012-04-14 MED ORDER — FLUCONAZOLE 100 MG PO TABS: 100.0000 mg | ORAL_TABLET | Freq: Every day | ORAL | Status: DC

## 2012-04-14 MED ORDER — METRONIDAZOLE 0.75 % EX GEL: Freq: Every evening | CUTANEOUS | Status: DC | PRN

## 2012-04-14 NOTE — Patient Instructions (Signed)
Fluconazole for thrush in mouth  Will try metronidazole cream to vagina every night instead of flagyl douche that we discussed

## 2012-04-14 NOTE — Progress Notes (Signed)
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 She was an hour late for appointment at this office today and is also late for RT, so that she has had to be worked in for both appointments now. Patient tells me that she has been sleeping so poorly that she could not get up to be on time for appointments.  Patient presented to ED 01-11-12 with complaints of foul vaginal odor x 2 weeks, cultures negative for chlamydia and GC and treatment given with rocephin and zithromax. She returned to ED on 02-22-12 with ongoing problem, with finding of large mass obstructing cervical os. She was referred to Dr Scheryl Darter, with hx of last PAP several years previously; biopsy of the cervical mass showed invasive poorly differentiated squamous cell carcinoma (path [id number]). She had transabdominal and transvaginal US during this evaluation. CT CAP 03-13-12 had probable residual thymic tissue in mediastinum, 7 cm cervical mass with poor fat plane between this and posterior bladder, bilateral internal iliac and left pelvic sidewall adenopathy. She saw Dr Duard Brady on 03-15-12, clinical stage IIB. She was then seen by Dr Roselind Messier and had PET 03-22-12, wih uptake in retroperitoneal and pelvic nodes and the cervical mass; she also had mild fullness of right renal collecting system. RT is scheduled thru  thru 05-11-12.  Patient has had just a little vaginal bleeding when she does vinegar and peroxide douches as instructed by gyn onc; these douches improve vaginal odor for ~ 30 min only. She has had some mild nausea without vomiting, but is mostly bothered by the vaginal odor, and now intolerance to food odors since chemo. She is able to drink liquids including water, OJ and other juices, but is not eating. We have given  her samples of Ensure and Boost now. Mouth is coated and "foamy" Bowels are moving. She has no increased pain including LLQ. She has no LE swelling. She has had no fever or other clear symptoms of infection. Peripheral IV access good. Remainder of 10 point Review of Systems negative.  Objective:  Vital signs in last 24 hours:  BP 110/65  Pulse 110  Temp 98.9 F (37.2 C) (Oral)  Resp 20  Ht 5\' 6"  (1.676 m)  Wt 233 lb 12.8 oz (106.051 kg)  BMI 37.74 kg/m2  LMP 03/02/2012 Weight is down ~ 12 lbs from 04-03-12.  Awake, easily mobile, NAD, very pleasant. Odor apparent tho not as much as at first visit.  HEENT:PERRLA, sclera clear, anicteric and oropharynx clear, no lesions LymphaticsCervical, supraclavicular, and axillary nodes normal. Resp: clear to auscultation bilaterally and normal percussion bilaterally Cardio: regular rate and rhythm GI: obese, soft, no appreciable HSM or mass, nontender, normal BS Extremities: extremities normal, atraumatic, no cyanosis or edema Neuro:no sensory deficits noted Skin without rash or ecchymosis. Site of IV access not remarkable  Lab Results:  Results for orders placed in visit on 04/14/12  COMPREHENSIVE METABOLIC PANEL (CC13)      Component Value Range   Sodium 132 (*) 136 - 145 mEq/L   Potassium 3.5  3.5 - 5.1 mEq/L   Chloride 95 (*) 98 - 107 mEq/L   CO2 26  22 - 29 mEq/L   Glucose 104 (*) 70 - 99 mg/dl   BUN 9.0  7.0 - 65.7 mg/dL   Creatinine 1.1  0.6 -  1.1 mg/dL   Total Bilirubin 1.61  0.20 - 1.20 mg/dL   Alkaline Phosphatase 59  40 - 150 U/L   AST 10  5 - 34 U/L   ALT 11  0 - 55 U/L   Total Protein 7.1  6.4 - 8.3 g/dL   Albumin 3.0 (*) 3.5 - 5.0 g/dL   Calcium 8.9  8.4 - 09.6 mg/dL  MAGNESIUM (EA54)      Component Value Range   Magnesium 2.0  1.5 - 2.5 mg/dl     Studies/Results:  No results found.  Medications: I have reviewed the patient's current medications, is tolerating oral iron. I have discussed with pharmacist at [facility], as I think flagyl would be reasonable to try for the odor, but I would prefer intravaginal rather than po due to food aversion also from chemo. Liquid preparation would need to come from a compounding pharmacy, and unfortunately patient does not have insurance or funds for this; as she has assistance $ from [facility] for [facility], we will try metronidazole ointment or gel intravaginally and consider po flagyl if not helpful enough.  Note patient is not able to work now, had not worked 90 days so apparently is not eligible for [employer]   Assessment/Plan: 1.Squamous cell carcinoma of cervix: continue weekly CDDP with CBC day of each treatment and chemistries/Mg on Fridays prior to each treatment on Mon. I will see her back on 12-27 or sooner if needed. Try metronidazole topical. May need dietician to see 2.anemia from gyn bleeding, which likely will be exacerbated by chemo/RT: now on oral iron 3.BTL 4.flu vaccine done   Derenda Giddings P, MD   04/14/2012, 9:11 PM

## 2012-04-16 ENCOUNTER — Other Ambulatory Visit: Payer: Self-pay | Admitting: Oncology

## 2012-04-17 ENCOUNTER — Ambulatory Visit
Admission: RE | Admit: 2012-04-17 | Discharge: 2012-04-17 | Disposition: A | Discharge status: Home / Self Care | Payer: Medicaid Other | Source: Ambulatory Visit | Attending: Radiation Oncology | Admitting: Radiation Oncology

## 2012-04-17 ENCOUNTER — Ambulatory Visit (HOSPITAL_BASED_OUTPATIENT_CLINIC_OR_DEPARTMENT_OTHER): Payer: Self-pay

## 2012-04-17 ENCOUNTER — Other Ambulatory Visit: Payer: Self-pay | Admitting: Physician Assistant

## 2012-04-17 ENCOUNTER — Other Ambulatory Visit (HOSPITAL_BASED_OUTPATIENT_CLINIC_OR_DEPARTMENT_OTHER): Payer: Self-pay | Admitting: Lab

## 2012-04-17 ENCOUNTER — Other Ambulatory Visit (HOSPITAL_BASED_OUTPATIENT_CLINIC_OR_DEPARTMENT_OTHER): Payer: No Typology Code available for payment source | Admitting: Lab

## 2012-04-17 VITALS — BP 116/75 | HR 107 | Temp 98.5°F | Resp 20

## 2012-04-17 DIAGNOSIS — C539 Malignant neoplasm of cervix uteri, unspecified: Secondary | ICD-10-CM

## 2012-04-17 DIAGNOSIS — R350 Frequency of micturition: Secondary | ICD-10-CM

## 2012-04-17 DIAGNOSIS — R197 Diarrhea, unspecified: Secondary | ICD-10-CM

## 2012-04-17 LAB — CBC WITH DIFFERENTIAL/PLATELET
BASO%: 0.1 % (ref 0.0–2.0)
HCT: 31.1 % — ABNORMAL LOW (ref 34.8–46.6)
MCHC: 32.8 g/dL (ref 31.5–36.0)
MONO#: 0.4 10*3/uL (ref 0.1–0.9)
NEUT%: 84.8 % — ABNORMAL HIGH (ref 38.4–76.8)
RBC: 4.62 10*6/uL (ref 3.70–5.45)
RDW: 17.5 % — ABNORMAL HIGH (ref 11.2–14.5)
WBC: 8.1 10*3/uL (ref 3.9–10.3)
lymph#: 0.5 10*3/uL — ABNORMAL LOW (ref 0.9–3.3)
nRBC: 0 % (ref 0–0)

## 2012-04-17 LAB — URINALYSIS, MICROSCOPIC - CHCC
Nitrite: NEGATIVE
Protein: 30 mg/dL
Specific Gravity, Urine: 1.005 (ref 1.003–1.035)

## 2012-04-17 MED ORDER — ONDANSETRON 16 MG/50ML IVPB (CHCC): 16.0000 mg | Freq: Once | INTRAVENOUS | Status: DC

## 2012-04-17 MED ORDER — SODIUM CHLORIDE 0.9 % IV SOLN
Freq: Once | INTRAVENOUS | Status: AC
Start: 2012-04-17 — End: 2012-04-17
  Administered 2012-04-17: 11:00:00 via INTRAVENOUS

## 2012-04-17 MED ORDER — SODIUM CHLORIDE 0.9 % IV SOLN: 40.0000 mg/m2 | Freq: Once | INTRAVENOUS | Status: DC

## 2012-04-17 MED ORDER — SODIUM CHLORIDE 0.9 % IV SOLN
150.0000 mg | Freq: Once | INTRAVENOUS | Status: DC
  Filled 2012-04-17: qty 5

## 2012-04-17 MED ORDER — DEXAMETHASONE SODIUM PHOSPHATE 4 MG/ML IJ SOLN: 12.0000 mg | Freq: Once | INTRAMUSCULAR | Status: DC

## 2012-04-17 MED ORDER — POTASSIUM CHLORIDE 2 MEQ/ML IV SOLN
Freq: Once | INTRAVENOUS | Status: AC
  Administered 2012-04-17: 11:00:00 via INTRAVENOUS
  Filled 2012-04-17: qty 10

## 2012-04-17 NOTE — Patient Instructions (Signed)
[facility] Discharge Instructions for Patients  Today you received the following: IV fluids   BELOW ARE SYMPTOMS THAT SHOULD BE REPORTED IMMEDIATELY:  *FEVER GREATER THAN 100.5 F  *CHILLS WITH OR WITHOUT FEVER  NAUSEA AND VOMITING THAT IS NOT CONTROLLED WITH YOUR NAUSEA MEDICATION  *UNUSUAL SHORTNESS OF BREATH  *UNUSUAL BRUISING OR BLEEDING  TENDERNESS IN MOUTH AND THROAT WITH OR WITHOUT PRESENCE OF ULCERS  *URINARY PROBLEMS  *BOWEL PROBLEMS  UNUSUAL RASH Items with * indicate a potential emergency and should be followed up as soon as possible.  Feel free to call the clinic you have any questions or concerns. The clinic phone number is [phone].    

## 2012-04-17 NOTE — Progress Notes (Signed)
1330-No Cisplatin today per Karrie Doffing PA.  Finish pre-hydration fluids as ordered.  If patient has any further diarrhea today, obtain C-Diff sample per order.

## 2012-04-18 ENCOUNTER — Encounter: Payer: Self-pay | Admitting: Radiation Oncology

## 2012-04-18 ENCOUNTER — Ambulatory Visit
Admission: RE | Admit: 2012-04-18 | Discharge: 2012-04-18 | Disposition: A | Discharge status: Home / Self Care | Payer: Medicaid Other | Source: Ambulatory Visit | Attending: Radiation Oncology | Admitting: Radiation Oncology

## 2012-04-18 VITALS — BP 131/82 | HR 125 | Temp 98.5°F | Resp 20 | Wt 227.8 lb

## 2012-04-18 DIAGNOSIS — C539 Malignant neoplasm of cervix uteri, unspecified: Secondary | ICD-10-CM

## 2012-04-18 MED ORDER — BIAFINE EX EMUL
Freq: Every day | CUTANEOUS | Status: DC
  Administered 2012-04-18: 11:00:00 via TOPICAL

## 2012-04-18 NOTE — Progress Notes (Addendum)
Pt states she had diarrhea last weekend,  took Imodium for her diarrhea, last episode on 04/16/12. Pt c/o rectal soreness due to diarrhea. She states she is applying Vaseline. Advised pt to stop using Vaseline right away; she will ask dr what to use. She states her Biafine lotion was accidentally wasted by her daughter.  Pt c/o n/v daily, last episode yesterday morning. She takes Zofran once daily, Ativan QHS. Advised she take Zofran twice during day, Ativan QHS to help control nausea. Pt c/o fatigue, states she "stays in bed all day". Loss of appetite, drinks 2 nutritional supplements daily. Will schedule pt to see nutritionist.  Pt denies vaginal bleeding except when she is examined internally. Pt on Diflucan, applying Metronidazole cream to sanitary pad per Kerby Moors, RN, Med Onc. Pt did not receive chemo yesterday; she did receive IVF.

## 2012-04-18 NOTE — Progress Notes (Signed)
[facility]    Radiation Oncology [address] [place]     Maryln Gottron, M.D. [place], [place] [address]               Billie Lade, M.D., Ph.D. Phone: [phone]      Molli Hazard A. Kathrynn Running, M.D. Fax: [phone]      Radene Gunning, M.D., Ph.D.         Lurline Hare, M.D.         Grayland Jack, M.D Weekly Treatment Management Note  Name: Peggy Pitts     MRN: [id number]        CSN: [id number] Date: 04/18/2012      DOB: 12-10-1980  CC: Default, Provider, MD         Default    Status: Outpatient  Diagnosis: The encounter diagnosis was Cervical cancer.  Current Dose: 18 Gy  Current Fraction: 10  Planned Dose: 45 Gy  Narrative: [name] was seen today for weekly treatment management. The chart was checked and MVCT  were reviewed. Her chemotherapy was held earlier this week. She was feeling a poorly and had a fever. She did receive IV fluids. She denies any vaginal bleeding at this time but continues to have problems with strong odorous vaginal drainage.  she also had some diarrhea this week and has since had some proctitis symptoms. I recommended she use topical hemorrhoidal cream for this issue.  Review of patient's allergies indicates no known allergies.  Current Outpatient Prescriptions  Medication Sig Dispense Refill  . BIOTIN PO Take 1 tablet by mouth 2 (two) times a week.      . ferrous gluconate (FERGON) 324 MG tablet Take daily on empty stomach with OJ  30 tablet  4  . fluconazole (DIFLUCAN) 100 MG tablet Take 1 tablet (100 mg total) by mouth daily. For thrush  7 tablet  0  . loperamide (IMODIUM) 2 MG capsule Take 2 mg by mouth 4 (four) times daily as needed.      Marland Kitchen LORazepam (ATIVAN) 1 MG tablet Take 0.5 to 1 mg under tongue or swallow every 6 hours as needed for nausea.  Will make drowsy.  30 tablet  0  . metroNIDAZOLE (METROGEL) 0.75 % gel Apply topically at bedtime as needed. Apply to vagina  At bedtime insteat of douche.  45 g  1  .  naproxen sodium (ANAPROX) 220 MG tablet Take 220 mg by mouth 2 (two) times daily as needed.      . ondansetron (ZOFRAN) 8 MG tablet Take 1-2 tablets (8-16 mg total) by mouth every 8 (eight) hours as needed for nausea (Non drowsy).  30 tablet  1   Labs:  Lab Results  Component Value Date   WBC 8.1 04/17/2012   HGB 10.2* 04/17/2012   HCT 31.1* 04/17/2012   MCV 67.3* 04/17/2012   PLT 232 04/17/2012   Lab Results  Component Value Date   CREATININE 1.1 04/14/2012   BUN 9.0 04/14/2012   NA 132* 04/14/2012   K 3.5 04/14/2012   CL 95* 04/14/2012   CO2 26 04/14/2012   Lab Results  Component Value Date   ALT 11 04/14/2012   AST 10 04/14/2012   BILITOT 0.43 04/14/2012    Physical Examination:  weight is 227 lb 12.8 oz (103.329 kg). Her oral temperature is 98.5 F (36.9 C). Her blood pressure is 131/82 and her pulse is 125. Her respiration is 20.    Wt Readings from  Last 3 Encounters:  04/18/12 227 lb 12.8 oz (103.329 kg)  04/14/12 233 lb 12.8 oz (106.051 kg)  04/10/12 236 lb 3.2 oz (107.14 kg)     Lungs - Normal respiratory effort, chest expands symmetrically. Lungs are clear to auscultation, no crackles or wheezes.  Heart has regular rhythm and rate  Abdomen is soft and non tender with normal bowel sounds  Assessment:  Patient tolerating treatments well except for issues as above.  Plan: Continue treatment per original radiation prescription

## 2012-04-18 NOTE — Addendum Note (Signed)
Encounter addended by: Glennie Hawk, RN on: 04/18/2012 10:56 AM<BR>     Documentation filed: Inpatient MAR, Orders

## 2012-04-20 ENCOUNTER — Other Ambulatory Visit: Payer: Self-pay | Admitting: Radiation Oncology

## 2012-04-20 ENCOUNTER — Ambulatory Visit
Admission: RE | Admit: 2012-04-20 | Discharge: 2012-04-20 | Disposition: A | Discharge status: Home / Self Care | Payer: Medicaid Other | Source: Ambulatory Visit | Attending: Radiation Oncology | Admitting: Radiation Oncology

## 2012-04-20 DIAGNOSIS — C539 Malignant neoplasm of cervix uteri, unspecified: Secondary | ICD-10-CM

## 2012-04-21 ENCOUNTER — Telehealth: Payer: Self-pay | Admitting: Oncology

## 2012-04-21 ENCOUNTER — Encounter: Payer: Self-pay | Admitting: Oncology

## 2012-04-21 ENCOUNTER — Ambulatory Visit (HOSPITAL_BASED_OUTPATIENT_CLINIC_OR_DEPARTMENT_OTHER): Payer: No Typology Code available for payment source | Admitting: Oncology

## 2012-04-21 ENCOUNTER — Other Ambulatory Visit: Payer: Self-pay | Admitting: Lab

## 2012-04-21 ENCOUNTER — Ambulatory Visit
Admission: RE | Admit: 2012-04-21 | Discharge: 2012-04-21 | Disposition: A | Discharge status: Home / Self Care | Payer: Medicaid Other | Source: Ambulatory Visit | Attending: Radiation Oncology | Admitting: Radiation Oncology

## 2012-04-21 VITALS — BP 129/84 | HR 111 | Temp 99.2°F | Resp 18 | Ht 66.0 in | Wt 224.6 lb

## 2012-04-21 DIAGNOSIS — D649 Anemia, unspecified: Secondary | ICD-10-CM

## 2012-04-21 DIAGNOSIS — C539 Malignant neoplasm of cervix uteri, unspecified: Secondary | ICD-10-CM

## 2012-04-21 DIAGNOSIS — R197 Diarrhea, unspecified: Secondary | ICD-10-CM

## 2012-04-21 LAB — CBC WITH DIFFERENTIAL/PLATELET
BASO%: 0.2 % (ref 0.0–2.0)
HCT: 28.9 % — ABNORMAL LOW (ref 34.8–46.6)
LYMPH%: 5.4 % — ABNORMAL LOW (ref 14.0–49.7)
MCH: 23.1 pg — ABNORMAL LOW (ref 25.1–34.0)
MCHC: 33.5 g/dL (ref 31.5–36.0)
MCV: 69 fL — ABNORMAL LOW (ref 79.5–101.0)
MONO#: 0.6 10*3/uL (ref 0.1–0.9)
MONO%: 11.6 % (ref 0.0–14.0)
NEUT%: 77.8 % — ABNORMAL HIGH (ref 38.4–76.8)
Platelets: 173 10*3/uL (ref 145–400)
RBC: 4.19 10*6/uL (ref 3.70–5.45)
WBC: 4.8 10*3/uL (ref 3.9–10.3)

## 2012-04-21 LAB — COMPREHENSIVE METABOLIC PANEL (CC13)
AST: 15 U/L (ref 5–34)
Albumin: 3.3 g/dL — ABNORMAL LOW (ref 3.5–5.0)
Alkaline Phosphatase: 73 U/L (ref 40–150)
Potassium: 3.5 mEq/L (ref 3.5–5.1)
Sodium: 137 mEq/L (ref 136–145)
Total Protein: 7.8 g/dL (ref 6.4–8.3)

## 2012-04-21 NOTE — Patient Instructions (Signed)
Use imodium as directed if diarrhea. Call if fever or if imodium is not helping.  We will do chemo also on Jan 13, during last week of radiation

## 2012-04-21 NOTE — Telephone Encounter (Signed)
Gave pt appt for lab, ML, chemo  for January 3rd , per Dr. Darrold Span, patient does not have to see ML on 05/05/12, pt has lab on that day. Pt will see MD on 05/16/12, pt and MD aware

## 2012-04-21 NOTE — Progress Notes (Signed)
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 She had first CDDP on 04-10-12, but treatment was held 04-17-12 due to multiple episodes of watery diarrhea the 2 days prior.  Patient presented to ED 01-11-12 with complaints of foul vaginal odor x 2 weeks, cultures negative for chlamydia and GC and treatment given with rocephin and zithromax. She returned to ED on 02-22-12 with ongoing problem, with finding of large mass obstructing cervical os. She was referred to Dr Scheryl Darter, with hx of last PAP several years previously; biopsy of the cervical mass showed invasive poorly differentiated squamous cell carcinoma (path [id number]). She had transabdominal and transvaginal US during this evaluation. CT CAP 03-13-12 had probable residual thymic tissue in mediastinum, 7 cm cervical mass with poor fat plane between this and posterior bladder, bilateral internal iliac and left pelvic sidewall adenopathy. She saw Dr Duard Brady on 03-15-12, clinical stage IIB. She was then seen by Dr Roselind Messier and had PET 03-22-12, wih uptake in retroperitoneal and pelvic nodes and the cervical mass; she also had mild fullness of right renal collecting system. RT is scheduled thru thru 05-11-12.  Diarrhea on 12-21 and 04-16-12 was watery, large amounts, multiple times. This stopped with imodium after 2 days and had not recurred until loose stools earlier today. She has had nausea especially in AMs, better with prn zofran. She has had no fever and no one else at home has had diarrhea, tho there seems to be some GI illness in the community. Smells are still bothersome, however she has been able to eat a little more this week. She is having no bleeding now. The metronidazole cream seems to be helping the vaginal odor, tho she is more  irritated from diarrhea or RT or both. She is doing sitz baths regularly. Remainder of 10 point Review of Systems negative.  Objective:  Vital signs in last 24 hours:  BP 129/84  Pulse 111  Temp 99.2 F (37.3 C) (Oral)  Resp 18  Ht 5\' 6"  (1.676 m)  Wt 224 lb 9.6 oz (101.878 kg)  BMI 36.25 kg/m2 Weight is down 2.5 lbs since 04-18-12. Looks more comfortable, easily ambulatory. No significant odor today. No noticeable alopecia  HEENT:PERRLA, sclera clear, anicteric and oropharynx clear, no lesions LymphaticsCervical, supraclavicular, and axillary nodes normal. No inguinal adenopathy Resp: clear to auscultation bilaterally and normal percussion bilaterally Cardio: regular rate and rhythm GI: soft, non-tender; bowel sounds normal; no masses,  no organomegaly Extremities: extremities normal, atraumatic, no cyanosis or edema Neuro:no sensory deficits noted No central catheter  Lab Results:  Results for orders placed in visit on 04/17/12  URINE CULTURE      Component Value Range   Urine Culture, Routine Culture, Urine     final no growth CMET available after visit normal with exception of chloride 96, glu 104, BUN 6, alb 3.3 Magnesium 2.1  CBC drawn today (actually ordered for day of chemo) with WBC 4.8, ANC 3.7, Hgb 9.7 and plt 173k Studies/Results:  No results found.  Medications: I have reviewed the patient's current medications. Diarrhea seems most likely RT related and I have told her to use imodium if this continues, but to call if that does not resolve it or if other symptoms with the diarrhea. She continues oral iron.   Assessment/Plan: 1.Squamous cell carcinoma of cervix:  continue weekly CDDP with CBC day of each treatment and chemistries/Mg on Fridays prior to each treatment on Mon. With chemotherapy held on 12-23, I have scheduled another treatment on 05-08-12 which should be the final week of external beam RT. She will see PA on 1-3, then I will see her at least the  week after chemotherapy completes (week of 05-15-12) with counts and chemistries then. 2.anemia from gyn bleeding, which likely will be exacerbated by chemo/RT: now on oral iron  3.BTL  4.flu vaccine done  Patient and husband were comfortable with discussion and plan above.  Nairi Oswald P, MD   04/21/2012, 4:03 PM

## 2012-04-24 ENCOUNTER — Other Ambulatory Visit: Payer: Self-pay | Admitting: Oncology

## 2012-04-24 ENCOUNTER — Other Ambulatory Visit (HOSPITAL_BASED_OUTPATIENT_CLINIC_OR_DEPARTMENT_OTHER): Payer: No Typology Code available for payment source | Admitting: Lab

## 2012-04-24 ENCOUNTER — Ambulatory Visit (HOSPITAL_BASED_OUTPATIENT_CLINIC_OR_DEPARTMENT_OTHER): Payer: No Typology Code available for payment source

## 2012-04-24 ENCOUNTER — Ambulatory Visit
Admission: RE | Admit: 2012-04-24 | Discharge: 2012-04-24 | Disposition: A | Discharge status: Home / Self Care | Payer: Medicaid Other | Source: Ambulatory Visit | Attending: Radiation Oncology | Admitting: Radiation Oncology

## 2012-04-24 DIAGNOSIS — C539 Malignant neoplasm of cervix uteri, unspecified: Secondary | ICD-10-CM

## 2012-04-24 DIAGNOSIS — Z5111 Encounter for antineoplastic chemotherapy: Secondary | ICD-10-CM

## 2012-04-24 LAB — CBC WITH DIFFERENTIAL/PLATELET
Eosinophils Absolute: 0.2 10*3/uL (ref 0.0–0.5)
HCT: 27.6 % — ABNORMAL LOW (ref 34.8–46.6)
LYMPH%: 14.9 % (ref 14.0–49.7)
MCHC: 31.9 g/dL (ref 31.5–36.0)
MONO#: 0.3 10*3/uL (ref 0.1–0.9)
NEUT%: 68.4 % (ref 38.4–76.8)
Platelets: 141 10*3/uL — ABNORMAL LOW (ref 145–400)
WBC: 3.3 10*3/uL — ABNORMAL LOW (ref 3.9–10.3)

## 2012-04-24 MED ORDER — POTASSIUM CHLORIDE 2 MEQ/ML IV SOLN
Freq: Once | INTRAVENOUS | Status: AC
  Administered 2012-04-24: 11:00:00 via INTRAVENOUS
  Filled 2012-04-24: qty 10

## 2012-04-24 MED ORDER — SODIUM CHLORIDE 0.9 % IV SOLN
Freq: Once | INTRAVENOUS | Status: AC
  Administered 2012-04-24: 11:00:00 via INTRAVENOUS

## 2012-04-24 MED ORDER — SODIUM CHLORIDE 0.9 % IV SOLN
150.0000 mg | Freq: Once | INTRAVENOUS | Status: AC
  Administered 2012-04-24: 150 mg via INTRAVENOUS
  Filled 2012-04-24: qty 5

## 2012-04-24 MED ORDER — DEXAMETHASONE SODIUM PHOSPHATE 10 MG/ML IJ SOLN
10.0000 mg | Freq: Once | INTRAMUSCULAR | Status: AC
  Administered 2012-04-24: 10 mg via INTRAVENOUS

## 2012-04-24 MED ORDER — ONDANSETRON 16 MG/50ML IVPB (CHCC)
16.0000 mg | Freq: Once | INTRAVENOUS | Status: AC
  Administered 2012-04-24: 16 mg via INTRAVENOUS

## 2012-04-24 MED ORDER — SODIUM CHLORIDE 0.9 % IV SOLN
90.0000 mg | Freq: Once | INTRAVENOUS | Status: AC
  Administered 2012-04-24: 90 mg via INTRAVENOUS
  Filled 2012-04-24: qty 90

## 2012-04-24 NOTE — Patient Instructions (Addendum)
[facility] Discharge Instructions for Patients Receiving Chemotherapy  Today you received the following chemotherapy agents Cisplatin To help prevent nausea and vomiting after your treatment, we encourage you to take your nausea medication   Take it as often as prescribed.   If you develop nausea and vomiting that is not controlled by your nausea medication, call the clinic. If it is after clinic hours your family physician or the after hours number for the clinic or go to the Emergency Department.   BELOW ARE SYMPTOMS THAT SHOULD BE REPORTED IMMEDIATELY:  *FEVER GREATER THAN 100.5 F  *CHILLS WITH OR WITHOUT FEVER  NAUSEA AND VOMITING THAT IS NOT CONTROLLED WITH YOUR NAUSEA MEDICATION  *UNUSUAL SHORTNESS OF BREATH  *UNUSUAL BRUISING OR BLEEDING  TENDERNESS IN MOUTH AND THROAT WITH OR WITHOUT PRESENCE OF ULCERS  *URINARY PROBLEMS  *BOWEL PROBLEMS  UNUSUAL RASH Items with * indicate a potential emergency and should be followed up as soon as possible.  If this is your first treatment one of the nurses will contact you 24 hours after your treatment. Please let the nurse know about any problems that you may have experienced. Feel free to call the clinic you have any questions or concerns. The clinic phone number is [phone].   I have been informed and understand all the instructions given to me. I know to contact the clinic, my physician, or go to the Emergency Department if any problems should occur. I do not have any questions at this time, but understand that I may call the clinic during office hours   should I have any questions or need assistance in obtaining follow up care.    __________________________________________  _____________  __________ Signature of Patient or Authorized Representative            Date                   Time    __________________________________________ Nurse's Signature    Diarrhea Diarrhea is watery poop (stool). The  most common cause of diarrhea is a germ. Other causes include:  Food poisoning.  A reaction to medicine. HOME CARE   Drink clear fluids. This can stop you from losing too much body fluid (dehydration).  Drink enough fluids to keep your pee (urine) clear or pale yellow.  Avoid solid foods and dairy products until you start to feel better. Then start eating bland foods, such as:  Bananas.  Rice.  Crackers.  Applesauce.  Dry toast.  Avoid spicy foods, caffeine, and alcohol.  Your doctor may give medicine to help with cramps and watery poop. Take this as told. Avoid these medicines if you have a fever or blood in your poop.  Take your medicine as told. Finish them even if you start to feel better. GET HELP RIGHT AWAY IF:   The watery poop lasts longer than 3 days.  You have a fever.  Your baby is older than 3 months with a rectal temperature of 100.5 F (38.1 C) or higher for more than 1 day.  There is blood in your poop.  You start to throw up (vomit).  You lose too much fluid. MAKE SURE YOU:   Understand these instructions.  Will watch your condition.  Will get help right away if you are not doing well or get worse. Document Released: 09/29/2007 Document Revised: 07/05/2011 Document Reviewed: 09/29/2007 [facility] Patient Information 2013 [place], [place].

## 2012-04-25 ENCOUNTER — Telehealth: Payer: Self-pay

## 2012-04-25 ENCOUNTER — Other Ambulatory Visit: Payer: Self-pay

## 2012-04-25 ENCOUNTER — Ambulatory Visit
Admission: RE | Admit: 2012-04-25 | Discharge: 2012-04-25 | Disposition: A | Discharge status: Home / Self Care | Payer: Medicaid Other | Source: Ambulatory Visit | Attending: Radiation Oncology | Admitting: Radiation Oncology

## 2012-04-25 ENCOUNTER — Encounter (HOSPITAL_COMMUNITY)
Admission: RE | Admit: 2012-04-25 | Discharge: 2012-04-25 | Disposition: A | Discharge status: Home / Self Care | Payer: No Typology Code available for payment source | Source: Ambulatory Visit | Attending: Oncology | Admitting: Oncology

## 2012-04-25 DIAGNOSIS — C539 Malignant neoplasm of cervix uteri, unspecified: Secondary | ICD-10-CM

## 2012-04-25 NOTE — Telephone Encounter (Signed)
Message copied by Lorine Bears on Tue Apr 25, 2012  9:48 AM ------      Message from: Jama Flavors P      Created: Mon Apr 24, 2012 12:55 PM       Labs seen and need follow up: please ask Dr Roselind Messier what is his cutoff for hgb with this RT. If he needs higher than this, please let patient know and set up 1 unit PRBCs

## 2012-04-25 NOTE — Telephone Encounter (Signed)
Val stated that Dr. Roselind Messier would treat Ms. Hageman with a HGB. of 8.0 or greater.  Val will talk with Dr. Roselind Messier on Thurs. 04-27-12 to see if he would like repeat the cbc on Fri. 04-28-12.

## 2012-04-27 ENCOUNTER — Encounter: Payer: Self-pay | Admitting: Radiation Oncology

## 2012-04-27 ENCOUNTER — Ambulatory Visit
Admission: RE | Admit: 2012-04-27 | Discharge: 2012-04-27 | Disposition: A | Discharge status: Home / Self Care | Payer: No Typology Code available for payment source | Source: Ambulatory Visit | Attending: Radiation Oncology | Admitting: Radiation Oncology

## 2012-04-27 ENCOUNTER — Telehealth: Payer: Self-pay | Admitting: Radiation Oncology

## 2012-04-27 ENCOUNTER — Ambulatory Visit
Admission: RE | Admit: 2012-04-27 | Discharge: 2012-04-27 | Disposition: A | Discharge status: Home / Self Care | Payer: Medicaid Other | Source: Ambulatory Visit | Attending: Radiation Oncology | Admitting: Radiation Oncology

## 2012-04-27 ENCOUNTER — Other Ambulatory Visit: Payer: Self-pay

## 2012-04-27 ENCOUNTER — Encounter (HOSPITAL_COMMUNITY)
Admission: RE | Admit: 2012-04-27 | Discharge: 2012-04-27 | Disposition: A | Discharge status: Home / Self Care | Payer: No Typology Code available for payment source | Source: Ambulatory Visit | Attending: Oncology | Admitting: Oncology

## 2012-04-27 VITALS — BP 115/68 | HR 93 | Resp 16 | Wt 228.3 lb

## 2012-04-27 DIAGNOSIS — D649 Anemia, unspecified: Secondary | ICD-10-CM | POA: Insufficient documentation

## 2012-04-27 DIAGNOSIS — C539 Malignant neoplasm of cervix uteri, unspecified: Secondary | ICD-10-CM

## 2012-04-27 LAB — CBC WITH DIFFERENTIAL/PLATELET
BASO%: 0.3 % (ref 0.0–2.0)
EOS%: 7 % (ref 0.0–7.0)
Eosinophils Absolute: 0.2 10*3/uL (ref 0.0–0.5)
MCH: 23.1 pg — ABNORMAL LOW (ref 25.1–34.0)
MCV: 69.2 fL — ABNORMAL LOW (ref 79.5–101.0)
MONO%: 11 % (ref 0.0–14.0)
NEUT#: 1.8 10*3/uL (ref 1.5–6.5)
RBC: 3.93 10*6/uL (ref 3.70–5.45)
RDW: 19.1 % — ABNORMAL HIGH (ref 11.2–14.5)

## 2012-04-27 LAB — ABO/RH: ABO/RH(D): B POS

## 2012-04-27 LAB — COMPREHENSIVE METABOLIC PANEL (CC13)
ALT: 13 U/L (ref 0–55)
BUN: 10 mg/dL (ref 7.0–26.0)
CO2: 27 mEq/L (ref 22–29)
Calcium: 9.1 mg/dL (ref 8.4–10.4)
Chloride: 100 mEq/L (ref 98–107)
Creatinine: 0.9 mg/dL (ref 0.6–1.1)
Glucose: 89 mg/dl (ref 70–99)

## 2012-04-27 LAB — TYPE AND SCREEN
ABO/RH(D): B POS
Antibody Screen: NEGATIVE
Unit division: 0

## 2012-04-27 LAB — MAGNESIUM (CC13): Magnesium: 1.6 mg/dl (ref 1.5–2.5)

## 2012-04-27 NOTE — Telephone Encounter (Signed)
Melinda of the blood bank phoned to inquire about the need for blood held for this patient. Informed her that per Dr. Roselind Messier the blood was no longer needed since the patient's hemoglobin has risen to 9.1.

## 2012-04-27 NOTE — Progress Notes (Signed)
Patient presents to the clinic today accompanied by her significant other for PUT with Dr. Roselind Messier. Patient alert and oriented to person, place, and time. No distress noted. Steady gait noted. Pleasant affect noted. Patient denies pain at this time. Patient reports nausea first thing in the morning. Patient reports taking zofran daily. Patient reports frequent diarrhea. Patient reports that her bottom is sore from the constant wiping from the diarrhea. Patient denies burning with urination. Patient denies hematuria. Patient reports vaginal discharge has decreased greatly. Patient reports that this morning when she wiped she did see a "little bloody spotting." Reported all findings to Dr. Roselind Messier.

## 2012-04-27 NOTE — Progress Notes (Signed)
[facility]    Radiation Oncology [address] [place]     Peggy Pitts, M.D. [place], [place] [address]               Billie Lade, M.D., Ph.D. Phone: [phone]      Molli Hazard A. Kathrynn Running, M.D. Fax: [phone]      Radene Gunning, M.D., Ph.D.         Lurline Hare, M.D.         Grayland Jack, M.D Weekly Treatment Management Note  Name: Peggy Pitts     MRN: [id number]        CSN: [id number] Date: 04/27/2012      DOB: May 08, 1980  CC: Default, Provider, MD         Default    Status: Outpatient  Diagnosis: The encounter diagnosis was Cervical cancer.  Current Dose: 27 Gy  Current Fraction: 15  Planned Dose: 45 + Gy  Narrative: Peggy Pitts was seen today for weekly treatment management. The chart was checked and MVCT  were reviewed. She continues to have a nausea but this is controlled well with her medications.  She has minimal vaginal bleeding at this time. She has noticed a significant drop in the vaginal drainage over the past week.  She continues to have irritation in the rectum area related to her diarrhea.  I have again recommended she consider hemorrhoidal  cream for this issue.  Review of patient's allergies indicates no known allergies.  Current Outpatient Prescriptions  Medication Sig Dispense Refill  . BIOTIN PO Take 1 tablet by mouth 2 (two) times a week.      . ferrous gluconate (FERGON) 324 MG tablet Take daily on empty stomach with OJ  30 tablet  4  . loperamide (IMODIUM) 2 MG capsule Take 2 mg by mouth 4 (four) times daily as needed.      Marland Kitchen LORazepam (ATIVAN) 1 MG tablet Take 0.5 to 1 mg under tongue or swallow every 6 hours as needed for nausea.  Will make drowsy.  30 tablet  0  . metroNIDAZOLE (METROGEL) 0.75 % gel Apply topically at bedtime as needed. Apply to vagina  At bedtime insteat of douche.  45 g  1  . naproxen sodium (ANAPROX) 220 MG tablet Take 220 mg by mouth 2 (two) times daily as needed.      . ondansetron (ZOFRAN) 8  MG tablet Take 1-2 tablets (8-16 mg total) by mouth every 8 (eight) hours as needed for nausea (Non drowsy).  30 tablet  1   Labs:  Lab Results  Component Value Date   WBC 3.3* 04/24/2012   HGB 8.8* 04/24/2012   HCT 27.6* 04/24/2012   MCV 68.1* 04/24/2012   PLT 141* 04/24/2012   Lab Results  Component Value Date   CREATININE 1.0 04/21/2012   BUN 6.0* 04/21/2012   NA 137 04/21/2012   K 3.5 04/21/2012   CL 96* 04/21/2012   CO2 29 04/21/2012   Lab Results  Component Value Date   ALT 21 04/21/2012   AST 15 04/21/2012   BILITOT 0.25 04/21/2012    Physical Examination:  weight is 228 lb 4.8 oz (103.556 kg). Her blood pressure is 115/68 and her pulse is 93. Her respiration is 16.    Wt Readings from Last 3 Encounters:  04/27/12 228 lb 4.8 oz (103.556 kg)  04/21/12 224 lb 9.6 oz (101.878 kg)  04/18/12 227 lb 12.8 oz (103.329 kg)  Lungs - Normal respiratory effort, chest expands symmetrically. Lungs are clear to auscultation, no crackles or wheezes.  Heart has regular rhythm and rate  Abdomen is soft and non tender with normal bowel sounds  Assessment:  Patient tolerating treatments well except for issues as above  Plan: Continue treatment per original radiation prescription.  The patient will present to the  lab today for CBC.  If  She continues to be anemic she may require transfusion

## 2012-04-27 NOTE — Telephone Encounter (Signed)
Phoned patient as directed by Dr. Roselind Messier to inform her that her hemoglobin has risen to 9.1 therefore she will not need a blood transfusion. Patient expressed gratitude for the call and understanding. Routed message to Dr. Roselind Messier.

## 2012-04-28 ENCOUNTER — Other Ambulatory Visit: Payer: Self-pay | Admitting: Lab

## 2012-04-28 ENCOUNTER — Ambulatory Visit (HOSPITAL_BASED_OUTPATIENT_CLINIC_OR_DEPARTMENT_OTHER): Payer: No Typology Code available for payment source | Admitting: Physician Assistant

## 2012-04-28 ENCOUNTER — Ambulatory Visit
Admission: RE | Admit: 2012-04-28 | Discharge: 2012-04-28 | Disposition: A | Discharge status: Home / Self Care | Payer: Medicaid Other | Source: Ambulatory Visit | Attending: Radiation Oncology | Admitting: Radiation Oncology

## 2012-04-28 ENCOUNTER — Ambulatory Visit: Payer: Self-pay

## 2012-04-28 VITALS — BP 115/73 | HR 101 | Temp 98.0°F | Resp 20 | Ht 66.0 in | Wt 225.9 lb

## 2012-04-28 DIAGNOSIS — C539 Malignant neoplasm of cervix uteri, unspecified: Secondary | ICD-10-CM

## 2012-04-28 DIAGNOSIS — R197 Diarrhea, unspecified: Secondary | ICD-10-CM

## 2012-04-28 DIAGNOSIS — D649 Anemia, unspecified: Secondary | ICD-10-CM

## 2012-04-28 MED ORDER — POTASSIUM CHLORIDE ER 10 MEQ PO TBCR: EXTENDED_RELEASE_TABLET | ORAL | Status: DC

## 2012-04-28 MED ORDER — ACETAMINOPHEN 325 MG PO TABS: 650.0000 mg | ORAL_TABLET | Freq: Once | ORAL | Status: AC

## 2012-04-28 MED ORDER — LORAZEPAM 1 MG PO TABS: ORAL_TABLET | ORAL | Status: DC

## 2012-04-28 MED ORDER — SODIUM CHLORIDE 0.9 % IV SOLN: 250.0000 mL | Freq: Once | INTRAVENOUS | Status: AC

## 2012-04-28 NOTE — Patient Instructions (Addendum)
Follow up with Dr. Darrold Span as previously scheduled on 05/16/2012

## 2012-04-29 NOTE — Progress Notes (Signed)
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 She had first CDDP on 04-10-12, but treatment was held 04-17-12 due to multiple episodes of watery diarrhea the 2 days prior. CDDP was given12-30-13.  Patient presented to ED 01-11-12 with complaints of foul vaginal odor x 2 weeks, cultures negative for chlamydia and GC and treatment given with rocephin and zithromax. She returned to ED on 02-22-12 with ongoing problem, with finding of large mass obstructing cervical os. She was referred to Dr Scheryl Darter, with hx of last PAP several years previously; biopsy of the cervical mass showed invasive poorly differentiated squamous cell carcinoma (path [id number]). She had transabdominal and transvaginal US during this evaluation. CT CAP 03-13-12 had probable residual thymic tissue in mediastinum, 7 cm cervical mass with poor fat plane between this and posterior bladder, bilateral internal iliac and left pelvic sidewall adenopathy. She saw Dr Duard Brady on 03-15-12, clinical stage IIB. She was then seen by Dr Roselind Messier and had PET 03-22-12, wih uptake in retroperitoneal and pelvic nodes and the cervical mass; she also had mild fullness of right renal collecting system. RT is scheduled thru thru 05-11-12.  She continues to have several episodes of diarrhea but notes slightly fewer episodes with the use of imodium. She also continues to have nausea especially in AMs, better with prn zofran. She denies fever or chills. She has noted some spotting that started yesterday. She is unsure as to the origin of the blood- urethral vs vaginal. Smells are still bothersome, however she has been able to eat a little more this week. She is having no bleeding now. The metronidazole cream seems to be helping the vaginal odor, tho she is  more irritated from diarrhea or RT or both. She is doing sitz baths regularly. Remainder of 10 point Review of Systems negative.  Objective:  Vital signs in last 24 hours:  BP 115/73  Pulse 101  Temp 98 F (36.7 C) (Oral)  Resp 20  Ht 5\' 6"  (1.676 m)  Wt 225 lb 14.4 oz (102.468 kg)  BMI 36.46 kg/m2 Weight is down 2.5 lbs since 04-18-12. Looks more comfortable, easily ambulatory. No significant odor today. No noticeable alopecia  HEENT:PERRLA, sclera clear, anicteric and oropharynx clear, no lesions LymphaticsCervical, supraclavicular, and axillary nodes normal. No inguinal adenopathy Resp: clear to auscultation bilaterally and normal percussion bilaterally Cardio: regular rate and rhythm GI: soft, non-tender; bowel sounds normal; no masses,  no organomegaly Extremities: extremities normal, atraumatic, no cyanosis or edema Neuro:no sensory deficits noted No central catheter  Lab Results:  Results for orders placed during the hospital encounter of 04/27/12  PREPARE RBC (CROSSMATCH)      Component Value Range   Order Confirmation ORDER PROCESSED BY BLOOD BANK    TYPE AND SCREEN      Component Value Range   ABO/RH(D) B POS     Antibody Screen NEG     Sample Expiration 04/30/2012     Unit Number [id number]     Blood Component Type RED CELLS,LR     Unit division 00     Status of Unit REL FROM [facility]     Transfusion Status OK TO TRANSFUSE     Crossmatch Result Compatible    ABO/RH      Component Value Range   ABO/RH(D) B POS  final no growth CMET available after visit normal with exception of potassium 3.4,  alb 3.2 Magnesium 1.6 (all results from 04/27/12)  CBC drawn today (actually ordered for day of chemo) with WBC 2.5, ANC 1.8, Hgb 9.1 and plt 164k ( all results from 04/27/12) Studies/Results:  No results found.  Medications: I have reviewed the patient's current medications. Diarrhea seems most likely RT related. She will start KCL 10 meq by mouth daily.  Prescription for 30 tablets with no refill [facility] to [facility] As well as a refill for her iron supplement and Zofran   Assessment/Plan: 1.Squamous cell carcinoma of cervix: continue weekly CDDP with CBC day of each treatment and chemistries/Mg on Fridays prior to each treatment on Mon. She will another treatment on 05-08-12 which should be the final week of external beam RT. She will follow up with Dr. Darrold Span on 1/21/14with counts and chemistries then. 2.anemia from gyn bleeding, which likely will be exacerbated by chemo/RT: now on oral iron  3.BTL  4.flu vaccine done  Patient and husband were comfortable with discussion and plan above.  Tiana Loft E, PA-C   04/29/2012, 10:09 PM

## 2012-05-01 ENCOUNTER — Other Ambulatory Visit: Payer: Self-pay | Admitting: Oncology

## 2012-05-01 ENCOUNTER — Other Ambulatory Visit (HOSPITAL_BASED_OUTPATIENT_CLINIC_OR_DEPARTMENT_OTHER): Payer: No Typology Code available for payment source | Admitting: Lab

## 2012-05-01 ENCOUNTER — Ambulatory Visit
Admission: RE | Admit: 2012-05-01 | Discharge: 2012-05-01 | Disposition: A | Discharge status: Home / Self Care | Payer: Medicaid Other | Source: Ambulatory Visit | Attending: Radiation Oncology | Admitting: Radiation Oncology

## 2012-05-01 ENCOUNTER — Other Ambulatory Visit: Payer: Self-pay | Admitting: Lab

## 2012-05-01 ENCOUNTER — Ambulatory Visit (HOSPITAL_BASED_OUTPATIENT_CLINIC_OR_DEPARTMENT_OTHER): Payer: No Typology Code available for payment source

## 2012-05-01 VITALS — BP 129/85 | HR 116 | Temp 98.4°F

## 2012-05-01 DIAGNOSIS — C539 Malignant neoplasm of cervix uteri, unspecified: Secondary | ICD-10-CM

## 2012-05-01 DIAGNOSIS — Z5111 Encounter for antineoplastic chemotherapy: Secondary | ICD-10-CM

## 2012-05-01 LAB — CBC WITH DIFFERENTIAL/PLATELET
BASO%: 0.4 % (ref 0.0–2.0)
Basophils Absolute: 0 10*3/uL (ref 0.0–0.1)
EOS%: 5.2 % (ref 0.0–7.0)
HGB: 8.5 g/dL — ABNORMAL LOW (ref 11.6–15.9)
MCH: 22.3 pg — ABNORMAL LOW (ref 25.1–34.0)
RDW: 19.4 % — ABNORMAL HIGH (ref 11.2–14.5)
lymph#: 0.2 10*3/uL — ABNORMAL LOW (ref 0.9–3.3)
nRBC: 0 % (ref 0–0)

## 2012-05-01 LAB — COMPREHENSIVE METABOLIC PANEL (CC13)
ALT: 26 U/L (ref 0–55)
AST: 23 U/L (ref 5–34)
Albumin: 3.2 g/dL — ABNORMAL LOW (ref 3.5–5.0)
Alkaline Phosphatase: 66 U/L (ref 40–150)
Glucose: 107 mg/dl — ABNORMAL HIGH (ref 70–99)
Potassium: 2.8 mEq/L — ABNORMAL LOW (ref 3.5–5.1)
Sodium: 138 mEq/L (ref 136–145)
Total Protein: 7.5 g/dL (ref 6.4–8.3)

## 2012-05-01 MED ORDER — SODIUM CHLORIDE 0.9 % IV SOLN
150.0000 mg | Freq: Once | INTRAVENOUS | Status: AC
  Administered 2012-05-01: 150 mg via INTRAVENOUS
  Filled 2012-05-01: qty 5

## 2012-05-01 MED ORDER — SODIUM CHLORIDE 0.9 % IV SOLN
Freq: Once | INTRAVENOUS | Status: AC
  Administered 2012-05-01: 15:00:00 via INTRAVENOUS
  Filled 2012-05-01: qty 500

## 2012-05-01 MED ORDER — SODIUM CHLORIDE 0.9 % IV SOLN
Freq: Once | INTRAVENOUS | Status: AC
  Administered 2012-05-01: 10:00:00 via INTRAVENOUS

## 2012-05-01 MED ORDER — POTASSIUM CHLORIDE CRYS ER 20 MEQ PO TBCR
20.0000 meq | EXTENDED_RELEASE_TABLET | Freq: Once | ORAL | Status: AC
  Administered 2012-05-01: 20 meq via ORAL
  Filled 2012-05-01: qty 1

## 2012-05-01 MED ORDER — POTASSIUM CHLORIDE 2 MEQ/ML IV SOLN
Freq: Once | INTRAVENOUS | Status: AC
  Administered 2012-05-01: 10:00:00 via INTRAVENOUS
  Filled 2012-05-01: qty 10

## 2012-05-01 MED ORDER — ONDANSETRON 16 MG/50ML IVPB (CHCC)
16.0000 mg | Freq: Once | INTRAVENOUS | Status: AC
  Administered 2012-05-01: 16 mg via INTRAVENOUS

## 2012-05-01 MED ORDER — SODIUM CHLORIDE 0.9 % IV SOLN
90.0000 mg | Freq: Once | INTRAVENOUS | Status: AC
  Administered 2012-05-01: 90 mg via INTRAVENOUS
  Filled 2012-05-01: qty 90

## 2012-05-01 MED ORDER — DEXAMETHASONE SODIUM PHOSPHATE 10 MG/ML IJ SOLN
10.0000 mg | Freq: Once | INTRAMUSCULAR | Status: AC
  Administered 2012-05-01: 10 mg via INTRAVENOUS

## 2012-05-01 MED ORDER — LOPERAMIDE HCL 2 MG PO TABS
2.0000 mg | ORAL_TABLET | Freq: Once | ORAL | Status: AC
  Administered 2012-05-01: 2 mg via ORAL
  Filled 2012-05-01: qty 1

## 2012-05-01 NOTE — Patient Instructions (Addendum)
[facility] Discharge Instructions for Patients Receiving Chemotherapy  Today you received the following chemotherapy agents Cisplatin  To help prevent nausea and vomiting after your treatment, we encourage you to take your nausea medication as directed.   If you develop nausea and vomiting that is not controlled by your nausea medication, call the clinic. If it is after clinic hours your family physician or the after hours number for the clinic or go to the Emergency Department.   BELOW ARE SYMPTOMS THAT SHOULD BE REPORTED IMMEDIATELY:  *FEVER GREATER THAN 100.5 F  *CHILLS WITH OR WITHOUT FEVER  NAUSEA AND VOMITING THAT IS NOT CONTROLLED WITH YOUR NAUSEA MEDICATION  *UNUSUAL SHORTNESS OF BREATH  *UNUSUAL BRUISING OR BLEEDING  TENDERNESS IN MOUTH AND THROAT WITH OR WITHOUT PRESENCE OF ULCERS  *URINARY PROBLEMS  *BOWEL PROBLEMS  UNUSUAL RASH Items with * indicate a potential emergency and should be followed up as soon as possible.  Feel free to call the clinic you have any questions or concerns. The clinic phone number is [phone].   I have been informed and understand all the instructions given to me. I know to contact the clinic, my physician, or go to the Emergency Department if any problems should occur. I do not have any questions at this time, but understand that I may call the clinic during office hours   should I have any questions or need assistance in obtaining follow up care.  Hypokalemia Hypokalemia means a low potassium level in the blood.Potassium is an electrolyte that helps regulate the amount of fluid in the body. It also stimulates muscle contraction and maintains a stable acid-base balance.Most of the body's potassium is inside of cells, and only a very small amount is in the blood. Because the amount in the blood is so small, minor changes can have big effects.  PREPARATION FOR TEST Testing for potassium requires taking a blood sample  taken by needle from a vein in the arm. The skin is cleaned thoroughly before the sample is drawn. There is no other special preparation needed.  NORMAL VALUES Potassium levels below 3.5 mEq/L are abnormally low. Levels above 5.1 mEq/L are abnormally high. Ranges for normal findings may vary among different laboratories and hospitals. You should always check with your doctor after having lab work or other tests done to discuss the meaning of your test results and whether your values are considered within normal limits.  MEANING OF TEST  Your caregiver will go over the test results with you and discuss the importance and meaning of your results, as well as treatment options and the need for additional tests, if necessary. A potassium level is frequently part of a routine medical exam. It is usually included as part of a whole "panel" of tests for several blood salts (such as Sodium and Chloride). It may be done as part of follow-up when a low potassium level was found in the past or other blood salts are suspected of being out of balance. A low potassium level might be suspected if you have one or more of the following:  Symptoms of weakness.  Abnormal heart rhythms.  High blood pressure and are taking medication to control this, especially water pills (diuretics).  Kidney disease that can affect your potassium level .  Diabetes requiring the use of insulin. The potassium may fall after taking insulin, especially if the diabetes had been out of control for a while.  A condition requiring the use of cortisone-type medication  or certain types of antibiotics.  Vomiting and/or diarrhea for more than a day or two.  A stomach or intestinal condition that may not permit appropriate absorption of potassium.  Fainting episodes.  Mental confusion.  OBTAINING TEST RESULTS It is your responsibility to obtain your test results. Ask the lab or department performing the test when and how you will  get your results.  Please contact your caregiver directly if you have not received the results within one week. At that time, ask if there is anything different or new you should be doing in relation to the results.  TREATMENT Hypokalemia can be treated with potassium supplements taken by mouth and/or adjustments in your current medications. A diet high in potassium is also helpful. Foods with high potassium content are:  Peas, lentils, lima beans, nuts, and dried fruit.  Whole grain and bran cereals and breads.  Fresh fruit, vegetables (bananas, cantaloupe, grapefruit, oranges, tomatoes, honeydew melons, potatoes).  Orange and tomato juices.  Meats. If potassium supplement has been prescribed for you today or your medications have been adjusted, see your personal caregiver in time02 for a re-check. SEEK MEDICAL CARE IF:  There is a feeling of worsening weakness.  You experience repeated chest palpitations.  You are diabetic and having difficulty keeping your blood sugars in the normal range.  You are experiencing vomiting and/or diarrhea.  You are having difficulty with any of your regular medications. SEEK IMMEDIATE MEDICAL CARE IF:  You experience chest pain, shortness of breath, or episodes of dizziness.  You have been having vomiting or diarrhea for more than 2 days.  You have a fainting episode. MAKE SURE YOU:   Understand these instructions.  Will watch your condition.  Will get help right away if you are not doing well or get worse. Document Released: 04/12/2005 Document Revised: 07/05/2011 Document Reviewed: 03/23/2008 [facility] Patient Information 2013 [place], [place].  Hypomagnesemia Magnesium is a common ion (mineral) in the body which is needed for metabolism. It is about how the body handles food and other chemical reactions necessary for life. Only about 2% of the magnesium in our body is found in the blood. When this is low, it is called hypomagnesemia.  The blood will measure only a tiny amount of the magnesium in our body. When it is low in our blood, it does not mean that the whole body supply is low. The normal serum concentration ranges from 1.8-2.5 mEq/L. When the level gets to be less than 1.0 mEq/L, a number of problems begin to happen.  CAUSES   Receiving intravenous fluids without magnesium replacement.  Loss of magnesium from the bowel by naso-gastric suction.  Loss of magnesium from nausea and vomiting or severe diarrhea. Any of the inflammatory bowel conditions can cause this.  Abuse of alcohol often leads to low serum magnesium.  An inherited form of magnesium loss happens when the kidneys lose magnesium. This is called familial or primary hypomagnesemia.  Some medications such as diuretics also cause the loss of magnesium. SYMPTOMS  These following problems are worse if the changes in magnesium levels come on suddenly.  Tremor.  Confusion.  Muscle weakness.  Over-sensitive to sights and sounds.  Sensitive reflexes.  Depression.  Muscular fibrillations.  Over-reactivity of the nerves.  Irritability.  Psychosis.  Spasms of the hand muscles.  Tetany (where the muscles go into uncontrollable spasms). DIAGNOSIS  This condition can be diagnosed by blood tests. TREATMENT   In emergency, magnesium can be given intravenously (by vein).  If the condition is less worrisome, it can be corrected by diet. High levels of magnesium are found in green leafy vegetables, peas, beans and nuts among other things. It can also be given through medications by mouth.  If it is being caused by medications, changes can be made.  If alcohol is a problem, help is available if there are difficulties giving it up. Document Released: 01/06/2005 Document Revised: 07/05/2011 Document Reviewed: 12/01/2007 [facility] Patient Information 2013 [place], [place].

## 2012-05-01 NOTE — Progress Notes (Signed)
Spoke with patient in chemo per Dr Darrold Span request. Instructed patient to take potassium 2 tablets= three times a day including dose tonight. Use imodium as instructed, 2 or less bowel movements every 24 hours would be best. You are losing potassium with the diarrhea (pt reported 6-7 loose stools per day) as well as losing K+ and Mg+ from kidneys with the chemo. Pt notified she will have labs drawn 1/7 and needs to speak with Dr Precious Reel RN tomorrow to let her know how much diarrhea she is having. Will also have labs repeated 1/10 when she comes to see Tiana Loft, PA.

## 2012-05-01 NOTE — Progress Notes (Signed)
0950- Pt to RadOnc.  Escorted by Scott-dhp, rn 1020- Pt returned to infusion-dhp, rn Total urine output pre-Cisplatin 665ml-dhp, rn

## 2012-05-02 ENCOUNTER — Encounter: Payer: Self-pay | Admitting: Nutrition

## 2012-05-02 ENCOUNTER — Ambulatory Visit
Admission: RE | Admit: 2012-05-02 | Discharge: 2012-05-02 | Disposition: A | Discharge status: Home / Self Care | Payer: Medicaid Other | Source: Ambulatory Visit | Attending: Radiation Oncology | Admitting: Radiation Oncology

## 2012-05-02 ENCOUNTER — Other Ambulatory Visit: Payer: Self-pay | Admitting: Oncology

## 2012-05-02 ENCOUNTER — Other Ambulatory Visit (HOSPITAL_BASED_OUTPATIENT_CLINIC_OR_DEPARTMENT_OTHER): Payer: No Typology Code available for payment source | Admitting: Lab

## 2012-05-02 ENCOUNTER — Other Ambulatory Visit: Payer: Self-pay

## 2012-05-02 DIAGNOSIS — R197 Diarrhea, unspecified: Secondary | ICD-10-CM

## 2012-05-02 DIAGNOSIS — C539 Malignant neoplasm of cervix uteri, unspecified: Secondary | ICD-10-CM

## 2012-05-02 DIAGNOSIS — D649 Anemia, unspecified: Secondary | ICD-10-CM

## 2012-05-02 LAB — MAGNESIUM (CC13): Magnesium: 2.2 mg/dl (ref 1.5–2.5)

## 2012-05-02 LAB — BASIC METABOLIC PANEL (CC13)
BUN: 8 mg/dL (ref 7.0–26.0)
Potassium: 3.5 mEq/L (ref 3.5–5.1)
Sodium: 138 mEq/L (ref 136–145)

## 2012-05-02 NOTE — Progress Notes (Signed)
Patient was scheduled for nutrition consult at 9:00 am January 7th.  She showed up for appointment late at approximately 10:50 am.  I was unable to work her in and patient will reschedule at her convenience.

## 2012-05-02 NOTE — Progress Notes (Signed)
Left voice mail for Dr.Livesay's nurse to discuss transfusing packed rbc's this week as patient hgb down to 8.5.Patient to continue with radiation as scheduled but Dr.Kinard prefers hgb to be in range of 9 to 10.

## 2012-05-02 NOTE — Progress Notes (Signed)
Told Ms. Hatfieldthat her mag. = 2.2 today.  Her KCL is up to 3.5 today.  She is to continue taking KCL 20 meq tid.   Peggy Pitts continues to has explosive  loose stools ~3 times a day with smaller amounts twice more in a day. Dr. Darrold Span suggests she take imodium 2 tabs  Every 8 hours on a regular basis and then 1 in between if needed not to exceed 8 tabs in a 24 hour period.  Order placed for a stool for C-diff if patient can give specimen tomorrow or other days at [facility]. Pt. Is set up for T&C for 2 units prbc's 05-03-12 and transfusion for 05-05-12 as Hgb was 8.5 yesterday and needs to be between 9-10 for radiation per Dr. Roselind Messier.    Peggy Pitts is aware of these appts. and plans.  She verbalized understanding.

## 2012-05-03 ENCOUNTER — Ambulatory Visit
Admission: RE | Admit: 2012-05-03 | Discharge: 2012-05-03 | Disposition: A | Discharge status: Home / Self Care | Payer: Medicaid Other | Source: Ambulatory Visit | Attending: Radiation Oncology | Admitting: Radiation Oncology

## 2012-05-03 ENCOUNTER — Other Ambulatory Visit: Payer: Self-pay | Admitting: Lab

## 2012-05-03 ENCOUNTER — Telehealth: Payer: Self-pay | Admitting: *Deleted

## 2012-05-03 ENCOUNTER — Other Ambulatory Visit: Payer: Self-pay | Admitting: Oncology

## 2012-05-03 ENCOUNTER — Other Ambulatory Visit: Payer: Self-pay | Admitting: *Deleted

## 2012-05-03 VITALS — BP 124/71 | HR 101 | Temp 99.0°F | Wt 226.0 lb

## 2012-05-03 DIAGNOSIS — C539 Malignant neoplasm of cervix uteri, unspecified: Secondary | ICD-10-CM

## 2012-05-03 DIAGNOSIS — R197 Diarrhea, unspecified: Secondary | ICD-10-CM

## 2012-05-03 LAB — CLOSTRIDIUM DIFFICILE BY PCR: Toxigenic C. Difficile by PCR: POSITIVE — CR

## 2012-05-03 MED ORDER — METRONIDAZOLE 500 MG PO TABS: 500.0000 mg | ORAL_TABLET | Freq: Three times a day (TID) | ORAL | Status: DC

## 2012-05-03 NOTE — Telephone Encounter (Signed)
Called patient to let her know that stool is positive for c-diff. Prescription called to [facility] pharmacy for Flagyl 500mg  tid, to take at least 2 doses today. Pt instructed to take this med exactly as scheduled. Push fluids-gatorade and juices. Do not take imodium or lomotil. RN to follow up with her on 05/05/12.

## 2012-05-03 NOTE — Progress Notes (Signed)
Patient here for routine weekly assessment of pelvic radiation for cervical cancer.Has completed 19 of 25 treatments.Denies pain.Nausea controlled with anti-emetic.For blood transfusion Friday as hgb 8.5 on 05/01/12.Having diarrhea with each bathroom visit even on urination.Taking imodium.

## 2012-05-03 NOTE — Progress Notes (Signed)
[facility]    Radiation Oncology [address] [employer]     Maryln Gottron, M.D. [place], [place] [address]               Billie Lade, M.D., Ph.D. Phone: [phone]      Molli Hazard A. Kathrynn Running, M.D. Fax: [phone]      Radene Gunning, M.D., Ph.D.         Lurline Hare, M.D.         Grayland Jack, M.D Weekly Treatment Management Note  Name: Peggy Pitts     MRN: [id number]        CSN: [id number] Date: 05/03/2012      DOB: [birth year]  CC: Default, Provider, MD         Default    Status: Outpatient  Diagnosis: The encounter diagnosis was Cervical cancer.  Current Dose: 34.2 Gy  Current Fraction: 19  Planned Dose: 45 + Gy  Narrative: Peggy Pitts was seen today for weekly treatment management. The chart was checked and MVCT  were reviewed. She has had problems with diarrhea this week. I recommend she maximize her Imodium. She denies any significant vaginal drainage or bleeding at this time. She is quite pleased with this result.  patient is anemic and is scheduled for blood transfusion later this week.  Review of patient's allergies indicates no known allergies.  Current Outpatient Prescriptions  Medication Sig Dispense Refill  . BIOTIN PO Take 1 tablet by mouth 2 (two) times a week.      . ferrous gluconate (FERGON) 324 MG tablet Take daily on empty stomach with OJ  30 tablet  4  . loperamide (IMODIUM) 2 MG capsule Take 2 mg by mouth 4 (four) times daily as needed.      Marland Kitchen LORazepam (ATIVAN) 1 MG tablet Take 0.5 to 1 mg under tongue or swallow every 6 hours as needed for nausea.  Will make drowsy.  30 tablet  0  . metroNIDAZOLE (METROGEL) 0.75 % gel Apply topically at bedtime as needed. Apply to vagina  At bedtime insteat of douche.  45 g  1  . naproxen sodium (ANAPROX) 220 MG tablet Take 220 mg by mouth 2 (two) times daily as needed.      . ondansetron (ZOFRAN) 8 MG tablet Take 1-2 tablets (8-16 mg total) by mouth every 8 (eight) hours as needed for  nausea (Non drowsy).  30 tablet  1  . potassium chloride (K-DUR) 10 MEQ tablet Take 1 by mouth daily  30 tablet  0   No current facility-administered medications for this encounter.   Facility-Administered Medications Ordered in Other Encounters  Medication Dose Route Frequency Provider Last Rate Last Dose  . 0.9 %  sodium chloride infusion  250 mL Intravenous Once Lennis P Livesay, MD      . acetaminophen (TYLENOL) tablet 650 mg  650 mg Oral Once Reece Packer, MD       Labs:  Lab Results  Component Value Date   WBC 2.5* 05/01/2012   HGB 8.5* 05/01/2012   HCT 25.9* 05/01/2012   MCV 67.8* 05/01/2012   PLT 131* 05/01/2012   Lab Results  Component Value Date   CREATININE 0.8 05/02/2012   BUN 8.0 05/02/2012   NA 138 05/02/2012   K 3.5 05/02/2012   CL 104 05/02/2012   CO2 26 05/02/2012   Lab Results  Component Value Date   ALT 26 05/01/2012   AST 23 05/01/2012  BILITOT 0.20 05/01/2012    Physical Examination:  weight is 226 lb (102.513 kg). Her temperature is 99 F (37.2 C). Her blood pressure is 124/71 and her pulse is 101.    Wt Readings from Last 3 Encounters:  05/03/12 226 lb (102.513 kg)  04/28/12 225 lb 14.4 oz (102.468 kg)  04/27/12 228 lb 4.8 oz (103.556 kg)     Lungs - Normal respiratory effort, chest expands symmetrically. Lungs are clear to auscultation, no crackles or wheezes.  Heart has regular rhythm and rate  Abdomen is soft and non tender with normal bowel sounds  Assessment:  Patient tolerating treatments well  Plan: Continue treatment per original radiation prescription.  She is scheduled for her first high-dose-rate brachytherapy procedure on 05/10/2012. The patient's external beam radiation therapy will be held on that day.

## 2012-05-04 ENCOUNTER — Ambulatory Visit (HOSPITAL_BASED_OUTPATIENT_CLINIC_OR_DEPARTMENT_OTHER)
Admission: RE | Admit: 2012-05-04 | Discharge: 2012-05-04 | Disposition: A | Discharge status: Home / Self Care | Payer: Medicaid Other | Source: Ambulatory Visit | Attending: Radiation Oncology | Admitting: Radiation Oncology

## 2012-05-05 ENCOUNTER — Ambulatory Visit
Admission: RE | Admit: 2012-05-05 | Discharge: 2012-05-05 | Disposition: A | Discharge status: Home / Self Care | Payer: Medicaid Other | Source: Ambulatory Visit | Attending: Radiation Oncology | Admitting: Radiation Oncology

## 2012-05-05 ENCOUNTER — Other Ambulatory Visit: Payer: Self-pay | Admitting: Lab

## 2012-05-05 ENCOUNTER — Ambulatory Visit (HOSPITAL_BASED_OUTPATIENT_CLINIC_OR_DEPARTMENT_OTHER): Payer: No Typology Code available for payment source

## 2012-05-05 ENCOUNTER — Other Ambulatory Visit: Payer: Self-pay | Admitting: Oncology

## 2012-05-05 ENCOUNTER — Telehealth: Payer: Self-pay

## 2012-05-05 ENCOUNTER — Other Ambulatory Visit: Payer: Self-pay

## 2012-05-05 ENCOUNTER — Ambulatory Visit: Payer: Self-pay | Admitting: Nutrition

## 2012-05-05 VITALS — BP 124/85 | HR 114 | Temp 98.3°F | Resp 20 | Wt 219.0 lb

## 2012-05-05 DIAGNOSIS — C539 Malignant neoplasm of cervix uteri, unspecified: Secondary | ICD-10-CM

## 2012-05-05 DIAGNOSIS — E876 Hypokalemia: Secondary | ICD-10-CM

## 2012-05-05 DIAGNOSIS — D649 Anemia, unspecified: Secondary | ICD-10-CM

## 2012-05-05 DIAGNOSIS — R197 Diarrhea, unspecified: Secondary | ICD-10-CM

## 2012-05-05 LAB — COMPREHENSIVE METABOLIC PANEL (CC13)
ALT: 21 U/L (ref 0–55)
AST: 16 U/L (ref 5–34)
Alkaline Phosphatase: 79 U/L (ref 40–150)
Chloride: 96 mEq/L — ABNORMAL LOW (ref 98–107)
Creatinine: 0.8 mg/dL (ref 0.6–1.1)
Total Bilirubin: 0.3 mg/dL (ref 0.20–1.20)

## 2012-05-05 LAB — CBC WITH DIFFERENTIAL/PLATELET
BASO%: 0 % (ref 0.0–2.0)
Basophils Absolute: 0 10*3/uL (ref 0.0–0.1)
EOS%: 4.6 % (ref 0.0–7.0)
HCT: 26.1 % — ABNORMAL LOW (ref 34.8–46.6)
HGB: 8.5 g/dL — ABNORMAL LOW (ref 11.6–15.9)
LYMPH%: 10.3 % — ABNORMAL LOW (ref 14.0–49.7)
MCH: 22.1 pg — ABNORMAL LOW (ref 25.1–34.0)
MCHC: 32.6 g/dL (ref 31.5–36.0)
MCV: 67.8 fL — ABNORMAL LOW (ref 79.5–101.0)
NEUT%: 76.3 % (ref 38.4–76.8)
Platelets: 124 10*3/uL — ABNORMAL LOW (ref 145–400)

## 2012-05-05 LAB — PREPARE RBC (CROSSMATCH)

## 2012-05-05 LAB — MAGNESIUM (CC13): Magnesium: 1.5 mg/dl — CL (ref 1.5–2.5)

## 2012-05-05 MED ORDER — SODIUM CHLORIDE 0.9 % IV SOLN
250.0000 mL | Freq: Once | INTRAVENOUS | Status: AC
  Administered 2012-05-05: 250 mL via INTRAVENOUS

## 2012-05-05 MED ORDER — ACETAMINOPHEN 325 MG PO TABS
650.0000 mg | ORAL_TABLET | Freq: Once | ORAL | Status: AC
  Administered 2012-05-05: 650 mg via ORAL

## 2012-05-05 MED ORDER — SODIUM CHLORIDE 0.9 % IV SOLN
Freq: Once | INTRAVENOUS | Status: AC
  Administered 2012-05-05: 11:00:00 via INTRAVENOUS
  Filled 2012-05-05: qty 500

## 2012-05-05 MED ORDER — SODIUM CHLORIDE 0.9 % IV SOLN: INTRAVENOUS | Status: DC

## 2012-05-05 NOTE — Patient Instructions (Addendum)
Blood Transfusion  A blood transfusion replaces your blood or some of its parts. Blood is replaced when you have lost blood because of surgery, an accident, or for severe blood conditions like anemia. You can donate blood to be used on yourself if you have a planned surgery. If you lose blood during that surgery, your own blood can be given back to you. Any blood given to you is checked to make sure it matches your blood type. Your temperature, blood pressure, and heart rate (vital signs) will be checked often.  GET HELP RIGHT AWAY IF:   You feel sick to your stomach (nauseous) or throw up (vomit).  You have watery poop (diarrhea).  You have shortness of breath or trouble breathing.  You have blood in your pee (urine) or have dark colored pee.  You have chest pain or tightness.  Your eyes or skin turn yellow (jaundice).  You have a temperature by mouth above 102 F (38.9 C), not controlled by medicine.  You start to shake and have chills.  You develop a a red rash (hives) or feel itchy.  You develop lightheadedness or feel confused.  You develop back, joint, or muscle pain.  You do not feel hungry (lost appetite).  You feel tired, restless, or nervous.  You develop belly (abdominal) cramps. Document Released: 07/09/2008 Document Revised: 07/05/2011 Document Reviewed: 07/09/2008 [facility] Patient Information 2013 [place], [place]. Dehydration, Adult Dehydration is when you lose more fluids from the body than you take in. Vital organs like the kidneys, brain, and heart cannot function without a proper amount of fluids and salt. Any loss of fluids from the body can cause dehydration.  CAUSES   Vomiting.  Diarrhea.  Excessive sweating.  Excessive urine output.  Fever. SYMPTOMS  Mild dehydration  Thirst.  Dry lips.  Slightly dry mouth. Moderate dehydration  Very dry mouth.  Sunken eyes.  Skin does not bounce back quickly when lightly pinched and  released.  Dark urine and decreased urine production.  Decreased tear production.  Headache. Severe dehydration  Very dry mouth.  Extreme thirst.  Rapid, weak pulse (more than 100 beats per minute at rest).  Cold hands and feet.  Not able to sweat in spite of heat and temperature.  Rapid breathing.  Blue lips.  Confusion and lethargy.  Difficulty being awakened.  Minimal urine production.  No tears. DIAGNOSIS  Your caregiver will diagnose dehydration based on your symptoms and your exam. Blood and urine tests will help confirm the diagnosis. The diagnostic evaluation should also identify the cause of dehydration. TREATMENT  Treatment of mild or moderate dehydration can often be done at home by increasing the amount of fluids that you drink. It is best to drink small amounts of fluid more often. Drinking too much at one time can make vomiting worse. Refer to the home care instructions below. Severe dehydration needs to be treated at the hospital where you will probably be given intravenous (IV) fluids that contain water and electrolytes. HOME CARE INSTRUCTIONS   Ask your caregiver about specific rehydration instructions.  Drink enough fluids to keep your urine clear or pale yellow.  Drink small amounts frequently if you have nausea and vomiting.  Eat as you normally do.  Avoid:  Foods or drinks high in sugar.  Carbonated drinks.  Juice.  Extremely hot or cold fluids.  Drinks with caffeine.  Fatty, greasy foods.  Alcohol.  Tobacco.  Overeating.  Gelatin desserts.  Wash your hands well to avoid spreading  bacteria and viruses.  Only take over-the-counter or prescription medicines for pain, discomfort, or fever as directed by your caregiver.  Ask your caregiver if you should continue all prescribed and over-the-counter medicines.  Keep all follow-up appointments with your caregiver. SEEK MEDICAL CARE IF:  You have abdominal pain and it increases  or stays in one area (localizes).  You have a rash, stiff neck, or severe headache.  You are irritable, sleepy, or difficult to awaken.  You are weak, dizzy, or extremely thirsty. SEEK IMMEDIATE MEDICAL CARE IF:   You are unable to keep fluids down or you get worse despite treatment.  You have frequent episodes of vomiting or diarrhea.  You have blood or green matter (bile) in your vomit.  You have blood in your stool or your stool looks black and tarry.  You have not urinated in 6 to 8 hours, or you have only urinated a small amount of very dark urine.  You have a fever.  You faint. MAKE SURE YOU:   Understand these instructions.  Will watch your condition.  Will get help right away if you are not doing well or get worse. Document Released: 04/12/2005 Document Revised: 07/05/2011 Document Reviewed: 11/30/2010 [facility] Patient Information 2013 [place], [place].

## 2012-05-05 NOTE — Progress Notes (Signed)
1000-Pt to receive 30 meq. KCL and 16 meq Magnesium in 500 ml NS per order of Dr. Darrold Span.  K-3.2 and Mag-1.5 from today's lab work.  CBC repeated and Hgb remains at 8.5.  Pt to receive 2 units of PRBC's today per order of Dr. Darrold Span.  1230-OK to start 2nd peripheral IV per Dr. Darrold Span to administer PRBC's.  1st PIV site infusing IVF's with potassium and magnesium.

## 2012-05-05 NOTE — Telephone Encounter (Signed)
Peggy Pitts stated that the diarrhea has decreased since beginning flagyl on 05-03-12.  She experiences 2 explosive diarrheal movements with a couple of small ones in a day.  She has a lot of gas.

## 2012-05-05 NOTE — Progress Notes (Signed)
This is a 32 year old female patient diagnosed with cervical cancer. She is a patient of Dr. Jama Flavors. Past medical history reviewed but was not significant.  Medications include Biotin, fergon, Imodium,, Zofran, and K-Dur.  Labs include potassium 2.8, glucose 107, albumin 3.2 on January 6.  Height: 66 inches. Weight: 219 pounds on January 10. Usual body weight: 250 pounds 02/28/2012. BMI: 35.36.  Patient reports nausea was resolved with nausea medication. She has been experiencing diarrhea and was noted to be positive for C. difficile. Diarrhea has decreased on Flagyl.  Nutrition diagnosis: Inadequate oral intake related to nausea and diarrhea as evidenced by 12% weight loss in 2 months.  Intervention: I educated patient on consuming increased protein throughout the day in small frequent meals. I have educated her on strategies for eating while she has diarrhea. I provided fact sheets for her to take with her. I've also encouraged her to continue lactose-free oral nutrition supplements as tolerated.  Monitoring, evaluation, goals: Patient will tolerate oral diet to promote maintenance of lean body mass.  Next visit: Patient will contact me with questions or concerns.

## 2012-05-07 LAB — TYPE AND SCREEN: Unit division: 0

## 2012-05-08 ENCOUNTER — Ambulatory Visit (HOSPITAL_BASED_OUTPATIENT_CLINIC_OR_DEPARTMENT_OTHER): Payer: No Typology Code available for payment source

## 2012-05-08 ENCOUNTER — Other Ambulatory Visit: Payer: Self-pay | Admitting: Lab

## 2012-05-08 ENCOUNTER — Ambulatory Visit
Admission: RE | Admit: 2012-05-08 | Discharge: 2012-05-08 | Disposition: A | Discharge status: Home / Self Care | Payer: Medicaid Other | Source: Ambulatory Visit | Attending: Radiation Oncology | Admitting: Radiation Oncology

## 2012-05-08 ENCOUNTER — Other Ambulatory Visit: Payer: Self-pay | Admitting: Oncology

## 2012-05-08 ENCOUNTER — Other Ambulatory Visit: Payer: Self-pay | Admitting: Medical Oncology

## 2012-05-08 ENCOUNTER — Telehealth: Payer: Self-pay

## 2012-05-08 DIAGNOSIS — R197 Diarrhea, unspecified: Secondary | ICD-10-CM

## 2012-05-08 DIAGNOSIS — C539 Malignant neoplasm of cervix uteri, unspecified: Secondary | ICD-10-CM

## 2012-05-08 DIAGNOSIS — R5381 Other malaise: Secondary | ICD-10-CM

## 2012-05-08 LAB — CBC WITH DIFFERENTIAL/PLATELET
Basophils Absolute: 0 10*3/uL (ref 0.0–0.1)
EOS%: 2.9 % (ref 0.0–7.0)
Eosinophils Absolute: 0.1 10*3/uL (ref 0.0–0.5)
LYMPH%: 12.6 % — ABNORMAL LOW (ref 14.0–49.7)
MCH: 23.6 pg — ABNORMAL LOW (ref 25.1–34.0)
MCV: 70.7 fL — ABNORMAL LOW (ref 79.5–101.0)
MONO%: 9.4 % (ref 0.0–14.0)
Platelets: 122 10*3/uL — ABNORMAL LOW (ref 145–400)
RBC: 4.75 10*6/uL (ref 3.70–5.45)
RDW: 21.7 % — ABNORMAL HIGH (ref 11.2–14.5)
nRBC: 0 % (ref 0–0)

## 2012-05-08 LAB — BASIC METABOLIC PANEL (CC13)
CO2: 25 mEq/L (ref 22–29)
Calcium: 9.7 mg/dL (ref 8.4–10.4)
Creatinine: 0.8 mg/dL (ref 0.6–1.1)
Glucose: 100 mg/dl — ABNORMAL HIGH (ref 70–99)
Sodium: 135 mEq/L — ABNORMAL LOW (ref 136–145)

## 2012-05-08 LAB — MAGNESIUM (CC13): Magnesium: 1.3 mg/dl — CL (ref 1.5–2.5)

## 2012-05-08 MED ORDER — SODIUM CHLORIDE 0.9 % IV SOLN
INTRAVENOUS | Status: DC
  Filled 2012-05-08: qty 1000

## 2012-05-08 MED ORDER — SODIUM CHLORIDE 0.9 % IV SOLN
Freq: Once | INTRAVENOUS | Status: AC
  Administered 2012-05-08: 13:00:00 via INTRAVENOUS
  Filled 2012-05-08: qty 1000

## 2012-05-08 NOTE — Telephone Encounter (Signed)
Dr. Roselind Messier received message from Dr. Darrold Span regarding diarrhea.  He will cancell HDR on 05-10-12 but wants to see her tomorrow in follow up as scheduled.  Pt. And Dr. Darrold Span notified of this plan.

## 2012-05-08 NOTE — Progress Notes (Signed)
Patient states she had 10 to twelve episodes of diarrhea on Saturday and Sunday. Patient states she has has 3 to 4 episodes of diarrhea this morning. Patient denies muscle cramping, fever or chills. Patient complains of fatigue. Patient denies any dizziness.  Orthostatic Blood pressure at 1045 am Lying:  120/75 pulse 99  Sitting:  122/77 pulse 73  Standing:   109/70 pulse 126  Dr. Darrold Span notified.

## 2012-05-08 NOTE — Progress Notes (Signed)
Patient states she had 10 to twelve episodes of diarrhea on Saturday and Sunday. Patient states she has has 3 to 4 episodes of diarrhea this morning. Patient denies muscle cramping, fever or chills. Patient complains of fatigue. Patient denies any dizziness.  Orthostatic Blood pressure at 1045 am Lying:  120/75 pulse 99  Sitting:  122/77 pulse 73  Standing:   109/70 pulse 126  Dr. Livesay notified. 

## 2012-05-08 NOTE — Patient Instructions (Addendum)
Hypomagnesemia Magnesium is a common ion (mineral) in the body which is needed for metabolism. It is about how the body handles food and other chemical reactions necessary for life. Only about 2% of the magnesium in our body is found in the blood. When this is low, it is called hypomagnesemia. The blood will measure only a tiny amount of the magnesium in our body. When it is low in our blood, it does not mean that the whole body supply is low. The normal serum concentration ranges from 1.8-2.5 mEq/L. When the level gets to be less than 1.0 mEq/L, a number of problems begin to happen.  CAUSES   Receiving intravenous fluids without magnesium replacement.  Loss of magnesium from the bowel by naso-gastric suction.  Loss of magnesium from nausea and vomiting or severe diarrhea. Any of the inflammatory bowel conditions can cause this.  Abuse of alcohol often leads to low serum magnesium.  An inherited form of magnesium loss happens when the kidneys lose magnesium. This is called familial or primary hypomagnesemia.  Some medications such as diuretics also cause the loss of magnesium. SYMPTOMS  These following problems are worse if the changes in magnesium levels come on suddenly.  Tremor.  Confusion.  Muscle weakness.  Over-sensitive to sights and sounds.  Sensitive reflexes.  Depression.  Muscular fibrillations.  Over-reactivity of the nerves.  Irritability.  Psychosis.  Spasms of the hand muscles.  Tetany (where the muscles go into uncontrollable spasms). DIAGNOSIS  This condition can be diagnosed by blood tests. TREATMENT   In emergency, magnesium can be given intravenously (by vein).  If the condition is less worrisome, it can be corrected by diet. High levels of magnesium are found in green leafy vegetables, peas, beans and nuts among other things. It can also be given through medications by mouth.  If it is being caused by medications, changes can be made.  If  alcohol is a problem, help is available if there are difficulties giving it up. Document Released: 01/06/2005 Document Revised: 07/05/2011 Document Reviewed: 12/01/2007 [facility] Patient Information 2013 [place], [place].  Hypokalemia Hypokalemia means a low potassium level in the blood.Potassium is an electrolyte that helps regulate the amount of fluid in the body. It also stimulates muscle contraction and maintains a stable acid-base balance.Most of the body's potassium is inside of cells, and only a very small amount is in the blood. Because the amount in the blood is so small, minor changes can have big effects. PREPARATION FOR TEST Testing for potassium requires taking a blood sample taken by needle from a vein in the arm. The skin is cleaned thoroughly before the sample is drawn. There is no other special preparation needed. NORMAL VALUES Potassium levels below 3.5 mEq/L are abnormally low. Levels above 5.1 mEq/L are abnormally high. Ranges for normal findings may vary among different laboratories and hospitals. You should always check with your doctor after having lab work or other tests done to discuss the meaning of your test results and whether your values are considered within normal limits. MEANING OF TEST  Your caregiver will go over the test results with you and discuss the importance and meaning of your results, as well as treatment options and the need for additional tests, if necessary. A potassium level is frequently part of a routine medical exam. It is usually included as part of a whole "panel" of tests for several blood salts (such as Sodium and Chloride). It may be done as part of follow-up when a  low potassium level was found in the past or other blood salts are suspected of being out of balance. A low potassium level might be suspected if you have one or more of the following:  Symptoms of weakness.  Abnormal heart rhythms.  High blood pressure and are taking medication  to control this, especially water pills (diuretics).  Kidney disease that can affect your potassium level .  Diabetes requiring the use of insulin. The potassium may fall after taking insulin, especially if the diabetes had been out of control for a while.  A condition requiring the use of cortisone-type medication or certain types of antibiotics.  Vomiting and/or diarrhea for more than a day or two.  A stomach or intestinal condition that may not permit appropriate absorption of potassium.  Fainting episodes.  Mental confusion. OBTAINING TEST RESULTS It is your responsibility to obtain your test results. Ask the lab or department performing the test when and how you will get your results.  Please contact your caregiver directly if you have not received the results within one week. At that time, ask if there is anything different or new you should be doing in relation to the results. TREATMENT Hypokalemia can be treated with potassium supplements taken by mouth and/or adjustments in your current medications. A diet high in potassium is also helpful. Foods with high potassium content are:  Peas, lentils, lima beans, nuts, and dried fruit.  Whole grain and bran cereals and breads.  Fresh fruit, vegetables (bananas, cantaloupe, grapefruit, oranges, tomatoes, honeydew melons, potatoes).  Orange and tomato juices.  Meats. If potassium supplement has been prescribed for you today or your medications have been adjusted, see your personal caregiver in time02 for a re-check. SEEK MEDICAL CARE IF:  There is a feeling of worsening weakness.  You experience repeated chest palpitations.  You are diabetic and having difficulty keeping your blood sugars in the normal range.  You are experiencing vomiting and/or diarrhea.  You are having difficulty with any of your regular medications. SEEK IMMEDIATE MEDICAL CARE IF:  You experience chest pain, shortness of breath, or episodes of  dizziness.  You have been having vomiting or diarrhea for more than 2 days.  You have a fainting episode. MAKE SURE YOU:   Understand these instructions.  Will watch your condition.  Will get help right away if you are not doing well or get worse. Document Released: 04/12/2005 Document Revised: 07/05/2011 Document Reviewed: 03/23/2008 [facility] Patient Information 2013 [place], [place].  Patient to see Dr. Roselind Messier tomorrow no HDR per Sallye Ober. RN

## 2012-05-09 ENCOUNTER — Encounter: Payer: Self-pay | Admitting: Radiation Oncology

## 2012-05-09 ENCOUNTER — Ambulatory Visit: Admission: RE | Admit: 2012-05-09 | Payer: Medicaid Other | Source: Ambulatory Visit

## 2012-05-09 ENCOUNTER — Ambulatory Visit
Admission: RE | Admit: 2012-05-09 | Discharge: 2012-05-09 | Disposition: A | Discharge status: Home / Self Care | Payer: Medicaid Other | Source: Ambulatory Visit | Attending: Radiation Oncology | Admitting: Radiation Oncology

## 2012-05-09 ENCOUNTER — Other Ambulatory Visit: Payer: Self-pay | Admitting: Radiation Oncology

## 2012-05-09 VITALS — BP 117/78 | HR 101 | Resp 16 | Wt 220.4 lb

## 2012-05-09 DIAGNOSIS — C539 Malignant neoplasm of cervix uteri, unspecified: Secondary | ICD-10-CM

## 2012-05-09 NOTE — Progress Notes (Signed)
Patient presents to the clinic today accompanied by her significant other for a PUT with Dr. Roselind Messier. Patient has no received radiation treatment today. Treatment held per Dr. Roselind Messier. Patient alert and oriented to person, place, and time. No distress noted. Steady gait noted. Pleasant affect noted. Patient denies pain at this time. Patient reports that diarrhea continues but, is less severe. Patient reports that yesterday she went to the bathroom with diarrhea at least 10 times. Patient reports she has only had one episode of diarrhea this morning. Patient reports that she has not taken Imodium since being prescribed flagyl for c diff. Patient reports occasional episodes of nausea. Patient reports that her appetite has decreased. No weight loss noted since 05/05/2012; weight has remained stable. Patient verbalized understanding that HDR is cancelled for tomorrow due to diarrhea/c diff. Also, provided and reviewed new calandar with patient. Patient verbalized understanding. Reported all findings to Dr. Roselind Messier.

## 2012-05-09 NOTE — Progress Notes (Signed)
[facility]    Radiation Oncology [address] [place]     Maryln Gottron, M.D. [place], [place] [address]               Billie Lade, M.D., Ph.D. Phone: [phone]      Molli Hazard A. Kathrynn Running, M.D. Fax: [phone]      Radene Gunning, M.D., Ph.D.         Lurline Hare, M.D.         Grayland Jack, M.D Weekly Treatment Management Note  Name: Peggy Pitts     MRN: [id number]        CSN: [id number] Date: 05/09/2012      DOB: 08-26-1980  CC: Default, Provider, MD         Default    Status: Outpatient  Diagnosis: The encounter diagnosis was Cervical cancer.  Current Dose: 39.6 Gy  Current Fraction: 22  Planned Dose: 45 Gy+  Narrative: [name] was seen today for weekly treatment management. The chart was checked and MVCT  were reviewed. Patient is seen prior to her 23rd treatment. She has had significant diarrhea related to C. difficile infection. The patient's chemotherapy has been held this week. She did receive IV fluid supplementation.  She denies any further vaginal bleeding.  Review of patient's allergies indicates no known allergies.  Current Outpatient Prescriptions  Medication Sig Dispense Refill  . BIOTIN PO Take 1 tablet by mouth 2 (two) times a week.      . ferrous gluconate (FERGON) 324 MG tablet Take daily on empty stomach with OJ  30 tablet  4  . loperamide (IMODIUM) 2 MG capsule Take 2 mg by mouth 4 (four) times daily as needed.      Marland Kitchen LORazepam (ATIVAN) 1 MG tablet Take 0.5 to 1 mg under tongue or swallow every 6 hours as needed for nausea.  Will make drowsy.  30 tablet  0  . metroNIDAZOLE (FLAGYL) 500 MG tablet Take 1 tablet (500 mg total) by mouth 3 (three) times daily. Take at least 2 tablets today. Take exactly as scheduled  42 tablet  0  . metroNIDAZOLE (METROGEL) 0.75 % gel Apply topically at bedtime as needed. Apply to vagina  At bedtime insteat of douche.  45 g  1  . naproxen sodium (ANAPROX) 220 MG tablet Take 220 mg by mouth  2 (two) times daily as needed.      . ondansetron (ZOFRAN) 8 MG tablet Take 1-2 tablets (8-16 mg total) by mouth every 8 (eight) hours as needed for nausea (Non drowsy).  30 tablet  1  . potassium chloride (K-DUR) 10 MEQ tablet Take 1 by mouth daily  30 tablet  0   No current facility-administered medications for this encounter.   Facility-Administered Medications Ordered in Other Encounters  Medication Dose Route Frequency Provider Last Rate Last Dose  . 0.9 %  sodium chloride infusion  250 mL Intravenous Once Lennis P Livesay, MD      . acetaminophen (TYLENOL) tablet 650 mg  650 mg Oral Once Lennis Buzzy Han, MD       Labs:  Lab Results  Component Value Date   WBC 2.8* 05/08/2012   HGB 11.2* 05/08/2012   HCT 33.6* 05/08/2012   MCV 70.7* 05/08/2012   PLT 122* 05/08/2012   Lab Results  Component Value Date   CREATININE 0.8 05/08/2012   BUN 6.0* 05/08/2012   NA 135* 05/08/2012   K 3.3* 05/08/2012   CL  99 05/08/2012   CO2 25 05/08/2012   Lab Results  Component Value Date   ALT 21 05/05/2012   AST 16 05/05/2012   BILITOT 0.30 05/05/2012    Physical Examination:  weight is 220 lb 6.4 oz (99.973 kg). Her blood pressure is 117/78 and her pulse is 101. Her respiration is 16.    Wt Readings from Last 3 Encounters:  05/09/12 220 lb 6.4 oz (99.973 kg)  05/05/12 219 lb (99.338 kg)  05/03/12 226 lb (102.513 kg)     Lungs - Normal respiratory effort, chest expands symmetrically. Lungs are clear to auscultation, no crackles or wheezes.  Heart has regular rhythm and rate  Abdomen is soft and non tender with normal bowel sounds  Assessment:  Patient tolerating treatments well.  She continues to have significant diarrhea with related to her C. difficile infection.    Plan: Continue treatment per original radiation prescription.  The patient's first high-dose-rate implant scheduled for tomorrow has been canceled in light of the above issues. I will also hold off on the patient's external beam  treatment today and tomorrow. She will resume external beam radiation therapy on January 16 and will likely receive her first high-dose-rate implant next week.

## 2012-05-10 ENCOUNTER — Encounter (HOSPITAL_BASED_OUTPATIENT_CLINIC_OR_DEPARTMENT_OTHER): Payer: Self-pay | Admitting: *Deleted

## 2012-05-10 ENCOUNTER — Encounter (HOSPITAL_BASED_OUTPATIENT_CLINIC_OR_DEPARTMENT_OTHER): Admission: RE | Payer: Self-pay | Source: Ambulatory Visit

## 2012-05-10 ENCOUNTER — Ambulatory Visit (HOSPITAL_COMMUNITY): Payer: No Typology Code available for payment source

## 2012-05-10 ENCOUNTER — Ambulatory Visit: Payer: Medicaid Other

## 2012-05-10 ENCOUNTER — Ambulatory Visit: Payer: Medicaid Other | Admitting: Radiation Oncology

## 2012-05-10 ENCOUNTER — Ambulatory Visit (HOSPITAL_BASED_OUTPATIENT_CLINIC_OR_DEPARTMENT_OTHER)
Admission: RE | Admit: 2012-05-10 | Payer: No Typology Code available for payment source | Source: Ambulatory Visit | Admitting: Radiation Oncology

## 2012-05-10 SURGERY — INSERTION, UTERINE TANDEM AND RING OR CYLINDER, FOR BRACHYTHERAPY
Anesthesia: General

## 2012-05-10 NOTE — Progress Notes (Signed)
Pt instructed npo p mn 1/19 x zofran, pain med if needed w sip of water.  To [facility] 1 /20.   Orders pending, requested

## 2012-05-11 ENCOUNTER — Ambulatory Visit: Payer: Medicaid Other | Admitting: Radiation Oncology

## 2012-05-11 ENCOUNTER — Encounter: Payer: Self-pay | Admitting: Radiation Oncology

## 2012-05-11 ENCOUNTER — Ambulatory Visit: Payer: Medicaid Other

## 2012-05-11 ENCOUNTER — Ambulatory Visit
Admission: RE | Admit: 2012-05-11 | Discharge: 2012-05-11 | Disposition: A | Discharge status: Home / Self Care | Payer: No Typology Code available for payment source | Source: Ambulatory Visit | Attending: Radiation Oncology | Admitting: Radiation Oncology

## 2012-05-11 VITALS — BP 120/87 | HR 105 | Temp 98.4°F

## 2012-05-11 DIAGNOSIS — C539 Malignant neoplasm of cervix uteri, unspecified: Secondary | ICD-10-CM

## 2012-05-11 NOTE — Progress Notes (Signed)
   Department of Radiation Oncology  Phone:  [phone] Fax:        [phone]   Preop evaluation  The patient presented today for evaluation in preparation for upcoming surgery on January 20. She has been on break from her external beam treatment in light of her significant problems with diarrhea in part related to C. difficile infection.  Patient has completed approximately half of a 14 day course of Flagyl for this infection. She continues to have significant diarrhea approximately 5 stools per day. She denies any rectal bleeding. Since the patient is having active diarrhea and a history of C. difficile infection the operating room request that her surgery be postponed until she has completed her 14 day course of antibiotics. I do feel this is quite reasonable and I will give her today and Friday off from her external beam treatment. She will resume on Monday, January 20 with 3 treatments next week to complete her external beam therapy. Patient is tentatively scheduled for her first brachytherapy procedure on January 27.  I am unsure whether the patient will receive radiosensitizing chemotherapy next week, but will leave this decision to Dr. Darrold Span.  -----------------------------------  Billie Lade, PhD, MD

## 2012-05-12 ENCOUNTER — Ambulatory Visit: Payer: Medicaid Other

## 2012-05-12 ENCOUNTER — Ambulatory Visit (HOSPITAL_COMMUNITY): Admission: RE | Admit: 2012-05-12 | Payer: No Typology Code available for payment source | Source: Ambulatory Visit

## 2012-05-12 ENCOUNTER — Telehealth: Payer: Self-pay

## 2012-05-12 ENCOUNTER — Other Ambulatory Visit (HOSPITAL_COMMUNITY): Payer: No Typology Code available for payment source

## 2012-05-12 NOTE — Telephone Encounter (Signed)
   Peggy Packer, MD More Detail >>      Peggy Packer, MD      Sent: Thu May 11, 2012  8:08 PM    To: Lorine Bears, RN        [name]    MRN: [id number] DOB: 04/20/1981     Pt Home: [phone]               Message     Arnette Schaumann saw her 1-16, still at least 5 diarrhea stools daily. Please check on her 1-17 - I would like lab 1-17 as planned, and vitals including weight please, and I may need to see her if not doing better -- thanks

## 2012-05-12 NOTE — Telephone Encounter (Signed)
No return call from Ms. Hatifield.  Contact person listed is her husband with the same pone number listed for patient.

## 2012-05-12 NOTE — Telephone Encounter (Signed)
Tried to reach patient twice this am to follow up diarrhea and have pt. Come in to get lab to check a b-met and magnesium.  No return call to this time which is 1247.

## 2012-05-15 ENCOUNTER — Ambulatory Visit: Payer: Medicaid Other

## 2012-05-15 ENCOUNTER — Ambulatory Visit (HOSPITAL_BASED_OUTPATIENT_CLINIC_OR_DEPARTMENT_OTHER): Payer: No Typology Code available for payment source | Attending: Radiation Oncology

## 2012-05-15 ENCOUNTER — Ambulatory Visit
Admission: RE | Admit: 2012-05-15 | Discharge: 2012-05-15 | Disposition: A | Discharge status: Home / Self Care | Payer: Medicaid Other | Source: Ambulatory Visit | Attending: Radiation Oncology | Admitting: Radiation Oncology

## 2012-05-15 ENCOUNTER — Ambulatory Visit (HOSPITAL_BASED_OUTPATIENT_CLINIC_OR_DEPARTMENT_OTHER)
Admission: RE | Admit: 2012-05-15 | Payer: No Typology Code available for payment source | Source: Ambulatory Visit | Admitting: Radiation Oncology

## 2012-05-15 ENCOUNTER — Ambulatory Visit: Payer: Medicaid Other | Admitting: Radiation Oncology

## 2012-05-15 ENCOUNTER — Encounter (HOSPITAL_BASED_OUTPATIENT_CLINIC_OR_DEPARTMENT_OTHER): Admission: RE | Payer: Self-pay | Source: Ambulatory Visit

## 2012-05-15 HISTORY — DX: Anemia, unspecified: D64.9

## 2012-05-15 SURGERY — INSERTION, UTERINE TANDEM AND RING OR CYLINDER, FOR BRACHYTHERAPY
Anesthesia: General

## 2012-05-16 ENCOUNTER — Ambulatory Visit
Admission: RE | Admit: 2012-05-16 | Discharge: 2012-05-16 | Disposition: A | Discharge status: Home / Self Care | Payer: Medicaid Other | Source: Ambulatory Visit | Attending: Radiation Oncology | Admitting: Radiation Oncology

## 2012-05-16 ENCOUNTER — Encounter: Payer: Self-pay | Admitting: Radiation Oncology

## 2012-05-16 ENCOUNTER — Other Ambulatory Visit: Payer: Self-pay | Admitting: Lab

## 2012-05-16 ENCOUNTER — Ambulatory Visit: Payer: Medicaid Other

## 2012-05-16 ENCOUNTER — Telehealth: Payer: Self-pay | Admitting: Oncology

## 2012-05-16 ENCOUNTER — Ambulatory Visit: Payer: Self-pay | Admitting: Oncology

## 2012-05-16 VITALS — BP 137/82 | HR 115 | Resp 18 | Wt 217.4 lb

## 2012-05-16 DIAGNOSIS — C539 Malignant neoplasm of cervix uteri, unspecified: Secondary | ICD-10-CM

## 2012-05-16 NOTE — Telephone Encounter (Signed)
Pt came in and missed lab and est..r/s to tomorrow.....ok....printed and gv appt schedule to pt for Jan

## 2012-05-16 NOTE — Progress Notes (Signed)
[facility]    Radiation Oncology [address] [place]     Maryln Gottron, M.D. [place], [place] [address]               Billie Lade, M.D., Ph.D. Phone: [phone]      Molli Hazard A. Kathrynn Running, M.D. Fax: [phone]      Radene Gunning, M.D., Ph.D.         Lurline Hare, M.D.         Grayland Jack, M.D Weekly Treatment Management Note  Name: Peggy Pitts     MRN: [id number]        CSN: [id number] Date: 05/16/2012      DOB: [birth year]  CC: Default, Provider, MD         Default    Status: Outpatient  Diagnosis: The encounter diagnosis was Cervical cancer.  Current Dose: 43.2 Gy  Current Fraction: 24  Planned Dose: 45 + Gy  Narrative: Peggy Pitts was seen today for weekly treatment management. The chart was checked and MVCT  were reviewed. She is doing much better this week she denies any vaginal bleeding or rectal bleeding. Her diarrhea has cleared up. She has one more Flagyl to take later today to complete her course for C. difficile infection. She has minimal vaginal discharge at this time.  Shellfish allergy and Adhesive  Current Outpatient Prescriptions  Medication Sig Dispense Refill  . BIOTIN PO Take 1 tablet by mouth 2 (two) times a week.      . ferrous gluconate (FERGON) 324 MG tablet Take daily on empty stomach with OJ  30 tablet  4  . LORazepam (ATIVAN) 1 MG tablet Take 0.5 to 1 mg under tongue or swallow every 6 hours as needed for nausea.  Will make drowsy.  30 tablet  0  . metroNIDAZOLE (FLAGYL) 500 MG tablet Take 1 tablet (500 mg total) by mouth 3 (three) times daily. Take at least 2 tablets today. Take exactly as scheduled  42 tablet  0  . metroNIDAZOLE (METROGEL) 0.75 % gel Apply topically at bedtime as needed. Apply to vagina  At bedtime insteat of douche.  45 g  1  . naproxen sodium (ANAPROX) 220 MG tablet Take 220 mg by mouth 2 (two) times daily as needed.      . ondansetron (ZOFRAN) 8 MG tablet Take 1-2 tablets (8-16 mg total) by  mouth every 8 (eight) hours as needed for nausea (Non drowsy).  30 tablet  1  . potassium chloride (K-DUR) 10 MEQ tablet Take 1 by mouth daily  30 tablet  0   No current facility-administered medications for this encounter.   Facility-Administered Medications Ordered in Other Encounters  Medication Dose Route Frequency Provider Last Rate Last Dose  . 0.9 %  sodium chloride infusion  250 mL Intravenous Once Lennis P Livesay, MD      . acetaminophen (TYLENOL) tablet 650 mg  650 mg Oral Once Lennis Buzzy Han, MD       Labs:  Lab Results  Component Value Date   WBC 2.8* 05/08/2012   HGB 11.2* 05/08/2012   HCT 33.6* 05/08/2012   MCV 70.7* 05/08/2012   PLT 122* 05/08/2012   Lab Results  Component Value Date   CREATININE 0.8 05/08/2012   BUN 6.0* 05/08/2012   NA 135* 05/08/2012   K 3.3* 05/08/2012   CL 99 05/08/2012   CO2 25 05/08/2012   Lab Results  Component Value Date   ALT 21  05/05/2012   AST 16 05/05/2012   BILITOT 0.30 05/05/2012    Physical Examination:  weight is 217 lb 6.4 oz (98.612 kg). Her blood pressure is 137/82 and her pulse is 115. Her respiration is 18.    Wt Readings from Last 3 Encounters:  05/16/12 217 lb 6.4 oz (98.612 kg)  05/10/12 214 lb (97.07 kg)  05/09/12 220 lb 6.4 oz (99.973 kg)     Lungs - Normal respiratory effort, chest expands symmetrically. Lungs are clear to auscultation, no crackles or wheezes.  Heart has regular rhythm and rate  Abdomen is soft and non tender with normal bowel sounds  Assessment:  Patient tolerating treatments well at this time.  Plan: Continue treatment per original radiation prescription.  She will see Dr. Darrold Span tomorrow and likely will receive blood work. Patient is scheduled for her first brachytherapy procedure on January 27.

## 2012-05-16 NOTE — Progress Notes (Signed)
Radiation Oncology         [phone] ________________________________  Initial outpatient Consultation  Name: Peggy Pitts MRN: [id number]  Date: 05/16/2012  DOB: 06/27/1980   Pre-operative note  DIAGNOSIS: The encounter diagnosis was Cervical cancer.  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 female who is late last year was diagnosed with stage II-B cervical cancer. She proceeded to undergo therapy with external beam radiation treatments as well as radiosensitizing chemotherapy. She was originally scheduled for her first brachytherapy procedure approximately 1-1/2 weeks ago, however the patient developed severe diarrhea and was found to have C. difficile infection. Her first 2 brachy therapy procedures were therefore canceled.   the patient will complete her external beam radiation therapy tomorrow. She is scheduled for her first brachytherapy procedure on January 27.   PAST MEDICAL HISTORY:  has a past medical history of Cervical cancer; Diarrhea; Hypertension; Asthma; Anemia; Weakness generalized; and Nausea.    PAST SURGICAL HISTORY: Past Surgical History  Procedure Date  . Tubal ligation 2006  . Cervical biopsy 02/28/2012    poorly differentiated squamous cell ca    FAMILY HISTORY: family history includes Diabetes in her mother and Hypertension in her mother.  SOCIAL HISTORY:  reports that she has never smoked. She has never used smokeless tobacco. She reports that she drinks alcohol. She reports that she does not use illicit drugs.  ALLERGIES: Shellfish allergy and Adhesive  MEDICATIONS:  Current Outpatient Prescriptions  Medication Sig Dispense Refill  . BIOTIN PO Take 1 tablet by mouth 2 (two) times a week.      . ferrous gluconate (FERGON) 324 MG tablet Take daily on empty stomach with OJ  30 tablet  4  . LORazepam (ATIVAN) 1 MG tablet Take 0.5 to 1 mg under tongue or swallow every 6  hours as needed for nausea.  Will make drowsy.  30 tablet  0  . metroNIDAZOLE (FLAGYL) 500 MG tablet Take 1 tablet (500 mg total) by mouth 3 (three) times daily. Take at least 2 tablets today. Take exactly as scheduled  42 tablet  0  . metroNIDAZOLE (METROGEL) 0.75 % gel Apply topically at bedtime as needed. Apply to vagina  At bedtime insteat of douche.  45 g  1  . naproxen sodium (ANAPROX) 220 MG tablet Take 220 mg by mouth 2 (two) times daily as needed.      . ondansetron (ZOFRAN) 8 MG tablet Take 1-2 tablets (8-16 mg total) by mouth every 8 (eight) hours as needed for nausea (Non drowsy).  30 tablet  1  . potassium chloride (K-DUR) 10 MEQ tablet Take 1 by mouth daily  30 tablet  0   No current facility-administered medications for this encounter.   Facility-Administered Medications Ordered in Other Encounters  Medication Dose Route Frequency Provider Last Rate Last Dose  . 0.9 %  sodium chloride infusion  250 mL Intravenous Once Lennis P Livesay, MD      . acetaminophen (TYLENOL) tablet 650 mg  650 mg Oral Once Lennis P Livesay, MD        REVIEW OF SYSTEMS:  A 15 point review of systems is documented in the electronic medical record. This was obtained by the nursing staff. However, I reviewed this with the patient to discuss relevant findings and make appropriate changes.  She denies any more diarrhea. She will complete her Flagyl prescription this afternoon. She denies any vaginal bleeding. She has minimal   vaginal discharge at this time. She denies any urination difficulties.   PHYSICAL EXAM:  weight is 217 lb 6.4 oz (98.612 kg). Her blood pressure is 137/82 and her pulse is 115. Her respiration is 18.   BP 137/82  Pulse 115  Resp 18  Wt 217 lb 6.4 oz (98.612 kg)  General Appearance:    Alert, cooperative, no distress, appears stated age  Head:    Normocephalic, without obvious abnormality, atraumatic  Eyes:    PERRL, conjunctiva/corneas clear, EOM's intact     Nose:   Nares normal,  septum midline, mucosa normal, no drainage    or sinus tenderness  Throat:   Lips, mucosa, and tongue normal; teeth and gums normal  Neck:   Supple, symmetrical, trachea midline, no adenopathy;    thyroid:  no enlargement/tenderness/nodules; no carotid   bruit or JVD  Back:     Symmetric, no curvature, ROM normal, no CVA tenderness  Lungs:     Clear to auscultation bilaterally, respirations unlabored  Chest Wall:    No tenderness or deformity   Heart:    Regular rate and rhythm, no murmur, rub   or gallop     Abdomen:     Soft, non-tender, bowel sounds active all four quadrants,    no masses, no organomegaly  Genitalia:   will be performed in the operating room   Rectal:    will be performed in the operating room    Extremities:   Extremities normal, atraumatic, no cyanosis or edema  Pulses:   2+ and symmetric all extremities  Skin:   Skin color, texture, turgor normal, no rashes or lesions  Lymph nodes:   Cervical, supraclavicular, and axillary nodes normal  Neurologic:   CNII-XII intact, normal strength, sensation and reflexes    throughout    LABORATORY DATA:  Lab Results  Component Value Date   WBC 2.8* 05/08/2012   HGB 11.2* 05/08/2012   HCT 33.6* 05/08/2012   MCV 70.7* 05/08/2012   PLT 122* 05/08/2012   Lab Results  Component Value Date   NA 135* 05/08/2012   K 3.3* 05/08/2012   CL 99 05/08/2012   CO2 25 05/08/2012   Lab Results  Component Value Date   ALT 21 05/05/2012   AST 16 05/05/2012   ALKPHOS 79 05/05/2012   BILITOT 0.30 05/05/2012         IMPRESSION: 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 She will receive 4 subsequent treatments on a weekly basis.     ------------------------------------------------  -----------------------------------  Natilee Gauer D. Dietrick Barris, PhD, MD         

## 2012-05-16 NOTE — Progress Notes (Signed)
Patient presents to the clinic today unaccompanied for PUT with Dr. Roselind Messier. Patient alert and oriented to person, place, and time. No distress noted. Steady gait noted. Pleasant affect noted. Patient denies pain at this time. Patient reports her stools are more formed and she is only defecating  once per day. Patient denies burning with urination. Patient denies hematuria. Patient reports white milky vaginal discharge. Patient reports hair loss in perineal area. Patient reports the skin in her perineal area is "sluffing off." Patient reports using vaseline intensive care lotion on her perineal area once per day. Reported all findings to Dr. Roselind Messier.

## 2012-05-16 NOTE — Addendum Note (Signed)
Encounter addended by: Billie Lade, MD on: 05/16/2012  6:40 PM<BR>     Documentation filed: Visit Diagnoses, Notes Section

## 2012-05-17 ENCOUNTER — Ambulatory Visit: Payer: Medicaid Other

## 2012-05-17 ENCOUNTER — Other Ambulatory Visit (HOSPITAL_BASED_OUTPATIENT_CLINIC_OR_DEPARTMENT_OTHER): Payer: No Typology Code available for payment source | Admitting: Lab

## 2012-05-17 ENCOUNTER — Telehealth: Payer: Self-pay | Admitting: Oncology

## 2012-05-17 ENCOUNTER — Ambulatory Visit
Admission: RE | Admit: 2012-05-17 | Discharge: 2012-05-17 | Disposition: A | Discharge status: Home / Self Care | Payer: Medicaid Other | Source: Ambulatory Visit | Attending: Radiation Oncology | Admitting: Radiation Oncology

## 2012-05-17 ENCOUNTER — Other Ambulatory Visit: Payer: Self-pay | Admitting: *Deleted

## 2012-05-17 ENCOUNTER — Encounter: Payer: Self-pay | Admitting: Radiation Oncology

## 2012-05-17 ENCOUNTER — Ambulatory Visit (HOSPITAL_BASED_OUTPATIENT_CLINIC_OR_DEPARTMENT_OTHER): Payer: No Typology Code available for payment source | Admitting: Oncology

## 2012-05-17 ENCOUNTER — Encounter: Payer: Self-pay | Admitting: Oncology

## 2012-05-17 VITALS — BP 125/92 | HR 122 | Temp 97.6°F | Resp 20 | Ht 66.0 in | Wt 215.8 lb

## 2012-05-17 DIAGNOSIS — E876 Hypokalemia: Secondary | ICD-10-CM

## 2012-05-17 DIAGNOSIS — C539 Malignant neoplasm of cervix uteri, unspecified: Secondary | ICD-10-CM

## 2012-05-17 DIAGNOSIS — A0472 Enterocolitis due to Clostridium difficile, not specified as recurrent: Secondary | ICD-10-CM

## 2012-05-17 LAB — CBC WITH DIFFERENTIAL/PLATELET
BASO%: 0.4 % (ref 0.0–2.0)
EOS%: 1.9 % (ref 0.0–7.0)
HGB: 10.5 g/dL — ABNORMAL LOW (ref 11.6–15.9)
MCH: 23.8 pg — ABNORMAL LOW (ref 25.1–34.0)
MCV: 71.9 fL — ABNORMAL LOW (ref 79.5–101.0)
MONO%: 16.7 % — ABNORMAL HIGH (ref 0.0–14.0)
RBC: 4.41 10*6/uL (ref 3.70–5.45)
RDW: 25.2 % — ABNORMAL HIGH (ref 11.2–14.5)
lymph#: 0.5 10*3/uL — ABNORMAL LOW (ref 0.9–3.3)

## 2012-05-17 LAB — COMPREHENSIVE METABOLIC PANEL (CC13)
ALT: 12 U/L (ref 0–55)
AST: 14 U/L (ref 5–34)
Albumin: 3.7 g/dL (ref 3.5–5.0)
Alkaline Phosphatase: 72 U/L (ref 40–150)
BUN: 7 mg/dL (ref 7.0–26.0)
Calcium: 9.3 mg/dL (ref 8.4–10.4)
Chloride: 98 mEq/L (ref 98–107)
Potassium: 2.8 mEq/L — ABNORMAL LOW (ref 3.5–5.1)
Sodium: 136 mEq/L (ref 136–145)
Total Protein: 8 g/dL (ref 6.4–8.3)

## 2012-05-17 LAB — MAGNESIUM (CC13): Magnesium: 1.2 mg/dl — CL (ref 1.5–2.5)

## 2012-05-17 MED ORDER — METRONIDAZOLE 500 MG PO TABS: 500.0000 mg | ORAL_TABLET | Freq: Three times a day (TID) | ORAL | Status: DC

## 2012-05-17 MED ORDER — ONDANSETRON HCL 8 MG PO TABS: 8.0000 mg | ORAL_TABLET | Freq: Two times a day (BID) | ORAL | Status: DC | PRN

## 2012-05-17 MED ORDER — FERROUS GLUCONATE 324 (38 FE) MG PO TABS: ORAL_TABLET | ORAL | Status: DC

## 2012-05-17 MED ORDER — POTASSIUM CHLORIDE ER 10 MEQ PO TBCR: EXTENDED_RELEASE_TABLET | ORAL | Status: DC

## 2012-05-17 MED ORDER — MAGNESIUM OXIDE 400 (241.3 MG) MG PO TABS: ORAL_TABLET | ORAL | Status: DC

## 2012-05-17 MED ORDER — LORAZEPAM 1 MG PO TABS: ORAL_TABLET | ORAL | Status: DC

## 2012-05-17 NOTE — Progress Notes (Signed)
OFFICE PROGRESS NOTE   05/17/2012   Physicians: P.Gehrig, J.Debroah Loop, J.Kinard  INTERVAL HISTORY:   Patient is seen, alone for visit, in follow up of treatment for IIB poorly differentiated cervical carcinoma, radiation therapy ongoing and course to date complicated by C.difficile diarrhea. The C.diff was documented on 05-03-12, after what was initially thought to be RT related diarrhea became progressively more severe. She has had hypokalemia and hypomagnesemia related to cisplatin and the infectious diarrhea, which has required IV and po supplementation. She took last dose of 14 days' flagyl this am, and has been out of potassium for 3-4 days; she has not been on oral magnesium thus far due to diarrhea.  External beam radiation completed today (05-17-12); she had 3 treatments of sensitizing cisplatin on 12-16, 12-30 and 05-01-12,with delays and held after 05-01-12 due to C.diff diarrhea. First two planned HDR treatments were also delayed due to the C.diff infection, with first HDR now planned for 05-22-12.   We were unable to reach her in follow up by phone 05-12-12.  She missed lab and MD visit here yesterday, I believe not aware of the appointments, and I have communicated the EMR related appointment concerns to our administration.  Patient presented to ED 01-11-12 with complaints of foul vaginal odor x 2 weeks, cultures negative for chlamydia and GC and treatment given with rocephin and zithromax. She returned to ED on 02-22-12 with ongoing problem, with finding of large mass obstructing cervical os. She was referred to Dr Scheryl Darter, with hx of last PAP several years previously; biopsy of the cervical mass showed invasive poorly differentiated squamous cell carcinoma (path [id number]). She had transabdominal and transvaginal US during this evaluation. CT CAP 03-13-12 had probable residual thymic tissue in mediastinum, 7 cm cervical mass with poor fat plane between this and posterior bladder, bilateral  internal iliac and left pelvic sidewall adenopathy. She saw Dr Duard Brady on 03-15-12, clinical stage IIB.  PET 03-22-12, wih uptake in retroperitoneal and pelvic nodes and the cervical mass; she also had mild fullness of right renal collecting system. She has had external beam RT and sensitizing CDDP as above.   Patient has had mostly formed stools for past 3-4 days, with last frank diarrhea on 05-13-12; she completed 14 days' treatment with flagyl this AM. She has ongoing nausea, somewhat improved with zofran 8 mg during day and ativan at hs; we will refill antiemetics and I have suggested trying 16 mg zofran in AMs next few days. Taste is unpleasant from flagyl, however she has not missed any doses. The vaginal odor has resolved. She had cramping chest discomfort last weekend. She has had no bleeding and no fever or other concerns for infection. Skin is tender RT area without frank desquamation.  Remainder of 10 point Review of Systems negative.  Objective:  Vital signs in last 24 hours:  BP 125/92  Pulse 122  Temp 97.6 F (36.4 C) (Oral)  Resp 20  Ht 5\' 6"  (1.676 m)  Wt 215 lb 12.8 oz (97.886 kg)  BMI 34.83 kg/m2 Weight is down 8.5 lbs from late Dec. Easily ambulatory, looks the most comfortable that I have seen her in weeks. Respirations not labored RA.   HEENT:PERRLA, extra ocular movement intact, sclera clear, anicteric and oropharynx clear, no lesions LymphaticsCervical, supraclavicular, and axillary nodes normal. Resp: clear to auscultation bilaterally and normal percussion bilaterally Cardio: regular rate and rhythm GI: soft, not distended, bowel sounds normally active, not tender, no HSM appreciated Extremities: extremities normal, atraumatic, no  cyanosis or edema Neuro:no sensory deficits noted Breast:normal without suspicious masses, skin or nipple changes or axillary nodes Skin with minimal dry desquamation in inguinal regions and increased pigmentation in RT area.  Lab  Results:  Results for orders placed in visit on 05/17/12  CBC WITH DIFFERENTIAL      Component Value Range   WBC 1.9 (*) 3.9 - 10.3 10e3/uL   NEUT# 1.1 (*) 1.5 - 6.5 10e3/uL   HGB 10.5 (*) 11.6 - 15.9 g/dL   HCT 16.1 (*) 09.6 - 04.5 %   Platelets 129 (*) 145 - 400 10e3/uL   MCV 71.9 (*) 79.5 - 101.0 fL   MCH 23.8 (*) 25.1 - 34.0 pg   MCHC 33.1  31.5 - 36.0 g/dL   RBC 4.09  8.11 - 9.14 10e6/uL   RDW 25.2 (*) 11.2 - 14.5 %   lymph# 0.5 (*) 0.9 - 3.3 10e3/uL   MONO# 0.3  0.1 - 0.9 10e3/uL   Eosinophils Absolute 0.0  0.0 - 0.5 10e3/uL   Basophils Absolute 0.0  0.0 - 0.1 10e3/uL   NEUT% 56.4  38.4 - 76.8 %   LYMPH% 24.6  14.0 - 49.7 %   MONO% 16.7 (*) 0.0 - 14.0 %   EOS% 1.9  0.0 - 7.0 %   BASO% 0.4  0.0 - 2.0 %  COMPREHENSIVE METABOLIC PANEL (CC13)      Component Value Range   Sodium 136  136 - 145 mEq/L   Potassium 2.8 Repeated and Verified (*) 3.5 - 5.1 mEq/L   Chloride 98  98 - 107 mEq/L   CO2 26  22 - 29 mEq/L   Glucose 124 (*) 70 - 99 mg/dl   BUN 7.0  7.0 - 78.2 mg/dL   Creatinine 0.8  0.6 - 1.1 mg/dL   Total Bilirubin 9.56  0.20 - 1.20 mg/dL   Alkaline Phosphatase 72  40 - 150 U/L   AST 14  5 - 34 U/L   ALT 12  0 - 55 U/L   Total Protein 8.0  6.4 - 8.3 g/dL   Albumin 3.7  3.5 - 5.0 g/dL   Calcium 9.3  8.4 - 21.3 mg/dL  MAGNESIUM (YQ65)      Component Value Range   Magnesium 1.2 Repeated and Verified (*) 1.5 - 2.5 mg/dl    Discussed labs above. She will go directly to [facility] now, to resume K+ now (20 mEq x 3 today then bid until next labs checked) + start oral magnesium 400 mg daily.  Studies/Results:  No results found.  Medications: I have reviewed the patient's current medications.K+ and magnesium as above. Will also give an additional week of oral flagyl as I am concerned that 2 weeks' treatment may not have eradicated infection given concomitant chemo/RT and low white count. She will continue oral iron and prn zofran and  ativan.  Assessment/Plan:  1.poorly differentiated squamous cell carcinoma of cervix: treatment course as above, HDR to begin 05-22-12. Mild pancytopenia related to recent chemo, extended field RT, iron deficiency anemia. She will see med onc PA with labs ~ Jan 31 and I will see her at least late Feb, also with labs. 2.hypokalemia and hypomagnesemia: related to CDDP and C diff/ RT diarrhea. Resume oral K+ and add oral magnesium, which hopefully she will now be able to tolerate from standpoint of diarrhea. We will recheck Bmet and Mg on 2-7 at [facility]; fine for these to be rechecked with HDR treatments per Dr Roselind Messier  anesthesia. 3.C.diff diarrhea: patient agrees to one additional week treatment with flagyl due to complicating factors as above.   Reece Packer, MD   05/17/2012, 3:42 PM

## 2012-05-17 NOTE — Patient Instructions (Signed)
zofran 2 tablets (=16 mg) every 12 hours may help better with nausea for next few days  Potassium is low again today. Pick up potassium tablets now, take 2 now and 2 more in another two hours, then 2 tonight. Beginning tomorrow 05-18-12 take 2 tablets twice a day until we recheck labs next week.  Magnesium is also low. Start magnesium tablet 400mg  daily beginning today. Take with just a little water, not a full glass, as this can also make stools looser  We will give another week of the Flagyl to be sure C diff is completely gone if possible.  I will have you see PA and repeat labs ~ Jan 31 and will get labs also ~ Feb 7. Dr Darrold Span will see you again in late Feb.

## 2012-05-17 NOTE — Telephone Encounter (Signed)
s.w pt and advised on 1.31.14 appt...and to pick up updated schedule on nxt visit...pt aware

## 2012-05-18 ENCOUNTER — Ambulatory Visit: Payer: Medicaid Other

## 2012-05-19 ENCOUNTER — Encounter (HOSPITAL_BASED_OUTPATIENT_CLINIC_OR_DEPARTMENT_OTHER): Payer: Self-pay | Admitting: *Deleted

## 2012-05-19 NOTE — Progress Notes (Signed)
To [facility] at 0615 -Istat on arrival,Npo after Mn-may take zofran,potassium,flagyl with small amt water only that am.Orders pending from Dr Roselind Messier.

## 2012-05-21 NOTE — Anesthesia Preprocedure Evaluation (Addendum)
Anesthesia Evaluation  Patient identified by MRN, date of birth, ID band Patient awake    Reviewed: Allergy & Precautions, H&P , NPO status , Patient's Chart, lab work & pertinent test results, reviewed documented beta blocker date and time   History of Anesthesia Complications (+) AWARENESS UNDER ANESTHESIA  Airway Mallampati: II TM Distance: >3 FB Neck ROM: full    Dental No notable dental hx.    Pulmonary asthma ,  breath sounds clear to auscultation  Pulmonary exam normal       Cardiovascular Exercise Tolerance: Good hypertension, Rhythm:regular Rate:Normal  H/O hypokalemia and hypomagnesemia secondary to C. Difficile diarrhea.   Neuro/Psych negative neurological ROS  negative psych ROS   GI/Hepatic negative GI ROS, Neg liver ROS,   Endo/Other  negative endocrine ROS  Renal/GU negative Renal ROS  negative genitourinary   Musculoskeletal   Abdominal   Peds  Hematology negative hematology ROS (+) anemia ,   Anesthesia Other Findings   Reproductive/Obstetrics negative OB ROS                         Anesthesia Physical Anesthesia Plan  ASA: II  Anesthesia Plan: General   Post-op Pain Management:    Induction:   Airway Management Planned: LMA  Additional Equipment:   Intra-op Plan:   Post-operative Plan:   Informed Consent: I have reviewed the patients History and Physical, chart, labs and discussed the procedure including the risks, benefits and alternatives for the proposed anesthesia with the patient or authorized representative who has indicated his/her understanding and acceptance.   Dental Advisory Given  Plan Discussed with: CRNA  Anesthesia Plan Comments:         Anesthesia Quick Evaluation

## 2012-05-22 ENCOUNTER — Ambulatory Visit
Admission: RE | Admit: 2012-05-22 | Discharge: 2012-05-22 | Disposition: A | Discharge status: Home / Self Care | Payer: Medicaid Other | Source: Ambulatory Visit | Attending: Radiation Oncology | Admitting: Radiation Oncology

## 2012-05-22 ENCOUNTER — Ambulatory Visit (HOSPITAL_BASED_OUTPATIENT_CLINIC_OR_DEPARTMENT_OTHER)
Admission: RE | Admit: 2012-05-22 | Discharge: 2012-05-22 | Disposition: A | Discharge status: Home / Self Care | Payer: No Typology Code available for payment source | Source: Ambulatory Visit | Attending: Radiation Oncology | Admitting: Radiation Oncology

## 2012-05-22 ENCOUNTER — Encounter (HOSPITAL_BASED_OUTPATIENT_CLINIC_OR_DEPARTMENT_OTHER): Payer: Self-pay | Admitting: Anesthesiology

## 2012-05-22 ENCOUNTER — Ambulatory Visit (HOSPITAL_BASED_OUTPATIENT_CLINIC_OR_DEPARTMENT_OTHER): Payer: No Typology Code available for payment source | Admitting: Anesthesiology

## 2012-05-22 ENCOUNTER — Encounter (HOSPITAL_BASED_OUTPATIENT_CLINIC_OR_DEPARTMENT_OTHER): Payer: Self-pay | Admitting: *Deleted

## 2012-05-22 ENCOUNTER — Ambulatory Visit (HOSPITAL_COMMUNITY)
Admission: RE | Admit: 2012-05-22 | Discharge: 2012-05-22 | Disposition: A | Discharge status: Home / Self Care | Payer: No Typology Code available for payment source | Source: Ambulatory Visit | Attending: Radiation Oncology | Admitting: Radiation Oncology

## 2012-05-22 ENCOUNTER — Encounter: Payer: Self-pay | Admitting: Radiation Oncology

## 2012-05-22 ENCOUNTER — Encounter (HOSPITAL_BASED_OUTPATIENT_CLINIC_OR_DEPARTMENT_OTHER)
Admission: RE | Disposition: A | Discharge status: Home / Self Care | Payer: Self-pay | Source: Ambulatory Visit | Attending: Radiation Oncology

## 2012-05-22 VITALS — BP 125/78 | HR 82 | Temp 98.2°F | Resp 18 | Wt 214.5 lb

## 2012-05-22 DIAGNOSIS — C539 Malignant neoplasm of cervix uteri, unspecified: Secondary | ICD-10-CM | POA: Insufficient documentation

## 2012-05-22 DIAGNOSIS — Z79899 Other long term (current) drug therapy: Secondary | ICD-10-CM | POA: Insufficient documentation

## 2012-05-22 HISTORY — PX: TANDEM RING INSERTION: SHX6199

## 2012-05-22 LAB — CBC WITH DIFFERENTIAL/PLATELET
Basophils Relative: 1 % (ref 0–1)
Eosinophils Relative: 1 % (ref 0–5)
HCT: 30.1 % — ABNORMAL LOW (ref 36.0–46.0)
Hemoglobin: 10.2 g/dL — ABNORMAL LOW (ref 12.0–15.0)
Lymphocytes Relative: 31 % (ref 12–46)
Monocytes Relative: 17 % — ABNORMAL HIGH (ref 3–12)
Neutro Abs: 1.2 10*3/uL — ABNORMAL LOW (ref 1.7–7.7)
WBC: 2.3 10*3/uL — ABNORMAL LOW (ref 4.0–10.5)

## 2012-05-22 LAB — POCT I-STAT 4, (NA,K, GLUC, HGB,HCT)
HCT: 32 % — ABNORMAL LOW (ref 36.0–46.0)
Sodium: 142 mEq/L (ref 135–145)

## 2012-05-22 SURGERY — INSERTION, UTERINE TANDEM AND RING OR CYLINDER, FOR BRACHYTHERAPY
Anesthesia: General | Site: Cervix | Wound class: Clean Contaminated

## 2012-05-22 MED ORDER — ONDANSETRON HCL 4 MG/2ML IJ SOLN
INTRAMUSCULAR | Status: DC | PRN
  Administered 2012-05-22: 4 mg via INTRAVENOUS

## 2012-05-22 MED ORDER — FENTANYL CITRATE 0.05 MG/ML IJ SOLN
25.0000 ug | INTRAMUSCULAR | Status: DC | PRN
  Filled 2012-05-22: qty 1

## 2012-05-22 MED ORDER — PROMETHAZINE HCL 25 MG/ML IJ SOLN
6.2500 mg | INTRAMUSCULAR | Status: DC | PRN
  Filled 2012-05-22: qty 1

## 2012-05-22 MED ORDER — LACTATED RINGERS IV SOLN
INTRAVENOUS | Status: DC
  Administered 2012-05-22 (×3): via INTRAVENOUS
  Filled 2012-05-22 (×2): qty 1000

## 2012-05-22 MED ORDER — DEXAMETHASONE SODIUM PHOSPHATE 4 MG/ML IJ SOLN
INTRAMUSCULAR | Status: DC | PRN
  Administered 2012-05-22: 10 mg via INTRAVENOUS

## 2012-05-22 MED ORDER — FENTANYL CITRATE 0.05 MG/ML IJ SOLN
INTRAMUSCULAR | Status: DC | PRN
  Administered 2012-05-22: 25 ug via INTRAVENOUS
  Administered 2012-05-22: 50 ug via INTRAVENOUS
  Administered 2012-05-22 (×2): 25 ug via INTRAVENOUS
  Administered 2012-05-22: 50 ug via INTRAVENOUS

## 2012-05-22 MED ORDER — MIDAZOLAM HCL 5 MG/5ML IJ SOLN
INTRAMUSCULAR | Status: DC | PRN
  Administered 2012-05-22: 2 mg via INTRAVENOUS

## 2012-05-22 MED ORDER — PROPOFOL 10 MG/ML IV BOLUS
INTRAVENOUS | Status: DC | PRN
  Administered 2012-05-22: 200 mg via INTRAVENOUS

## 2012-05-22 MED ORDER — WATER FOR IRRIGATION, STERILE IR SOLN
Status: DC | PRN
  Administered 2012-05-22: 250 mL via INTRAVESICAL

## 2012-05-22 MED ORDER — LIDOCAINE HCL (CARDIAC) 20 MG/ML IV SOLN
INTRAVENOUS | Status: DC | PRN
  Administered 2012-05-22: 100 mg via INTRAVENOUS

## 2012-05-22 MED ORDER — DIPHENHYDRAMINE HCL 50 MG/ML IJ SOLN
INTRAMUSCULAR | Status: DC | PRN
  Administered 2012-05-22: 12.5 mg via INTRAVENOUS

## 2012-05-22 SURGICAL SUPPLY — 25 items
BAG URINE DRAINAGE (UROLOGICAL SUPPLIES) ×2 IMPLANT
BANDAGE CONFORM 2  STR LF (GAUZE/BANDAGES/DRESSINGS) IMPLANT
CATH FOLEY 2WAY SLVR  5CC 16FR (CATHETERS) ×1
CATH FOLEY 2WAY SLVR 5CC 16FR (CATHETERS) ×1 IMPLANT
CLOTH BEACON ORANGE TIMEOUT ST (SAFETY) ×2 IMPLANT
COVER TABLE BACK 60X90 (DRAPES) ×2 IMPLANT
DRAPE LG THREE QUARTER DISP (DRAPES) ×2 IMPLANT
DRAPE UNDERBUTTOCKS STRL (DRAPE) ×2 IMPLANT
DRSG PAD ABDOMINAL 8X10 ST (GAUZE/BANDAGES/DRESSINGS) ×1 IMPLANT
GLOVE BIO SURGEON STRL SZ 6.5 (GLOVE) ×3 IMPLANT
GLOVE BIO SURGEON STRL SZ7.5 (GLOVE) ×2 IMPLANT
GLOVE ECLIPSE 6.0 STRL STRAW (GLOVE) ×1 IMPLANT
GOWN PREVENTION PLUS LG XLONG (DISPOSABLE) ×4 IMPLANT
HOLDER FOLEY CATH W/STRAP (MISCELLANEOUS) ×2 IMPLANT
LEGGING LITHOTOMY PAIR STRL (DRAPES) ×2 IMPLANT
PACK BASIN DAY SURGERY FS (CUSTOM PROCEDURE TRAY) ×2 IMPLANT
PAD OB MATERNITY 4.3X12.25 (PERSONAL CARE ITEMS) ×2 IMPLANT
PAD PREP 24X48 CUFFED NSTRL (MISCELLANEOUS) ×2 IMPLANT
PLUG CATH AND CAP STER (CATHETERS) ×2 IMPLANT
SET IRRIG Y TYPE TUR BLADDER L (SET/KITS/TRAYS/PACK) ×2 IMPLANT
SYRINGE 10CC LL (SYRINGE) ×2 IMPLANT
TOWEL OR 17X24 6PK STRL BLUE (TOWEL DISPOSABLE) ×4 IMPLANT
TRAY DSU PREP LF (CUSTOM PROCEDURE TRAY) ×2 IMPLANT
WATER STERILE IRR 3000ML UROMA (IV SOLUTION) ×2 IMPLANT
WATER STERILE IRR 500ML POUR (IV SOLUTION) ×2 IMPLANT

## 2012-05-22 NOTE — Transfer of Care (Signed)
  Immediate Anesthesia Transfer of Care Note  Patient: Peggy Pitts  Procedure(s) Performed: Procedure(s) (LRB): TANDEM RING INSERTION (N/A)  Patient Location: Patient transported to PACU with oxygen via face mask at 4 Liters / Min  Anesthesia Type: General  Level of Consciousness: awake and alert   Airway & Oxygen Therapy: Patient Spontanous Breathing and Patient connected to face mask oxygen  Post-op Assessment: Report given to PACU RN and Post -op Vital signs reviewed and stable  Post vital signs: Reviewed and stable  Dentition: Teeth and oropharynx remain in pre-op condition  Complications: No apparent anesthesia complications

## 2012-05-22 NOTE — Anesthesia Postprocedure Evaluation (Signed)
  Anesthesia Post-op Note  Patient: Peggy Pitts  Procedure(s) Performed: Procedure(s) (LRB): TANDEM RING INSERTION (N/A)  Patient Location: PACU  Anesthesia Type: General  Level of Consciousness: awake and alert   Airway and Oxygen Therapy: Patient Spontanous Breathing  Post-op Pain: mild  Post-op Assessment: Post-op Vital signs reviewed, Patient's Cardiovascular Status Stable, Respiratory Function Stable, Patent Airway and No signs of Nausea or vomiting  Last Vitals:  Filed Vitals:   05/22/12 0945  BP: 113/67  Pulse: 82  Temp:   Resp: 18    Post-op Vital Signs: stable   Complications: No apparent anesthesia complications

## 2012-05-22 NOTE — Brief Op Note (Signed)
05/22/2012  9:16 AM  PATIENT:  Peggy Pitts  32 y.o. female  PRE-OPERATIVE DIAGNOSIS:  CERVICAL CANCER  POST-OPERATIVE DIAGNOSIS:  CERVICAL CANCER  PROCEDURE:  Procedure(s) (LRB) with comments: TANDEM RING INSERTION (N/A)  SURGEON:  Surgeon(s) and Role:    * Billie Lade, MD - Primary  PHYSICIAN ASSISTANT:   ASSISTANTS: none   ANESTHESIA:   general  EBL:  Total I/O In: 1400 [I.V.:1400] Out: -   BLOOD ADMINISTERED:none  DRAINS: Urinary Catheter (Foley)   LOCAL MEDICATIONS USED:  NONE  SPECIMEN:  No Specimen  DISPOSITION OF SPECIMEN:  N/A  COUNTS:  YES  TOURNIQUET:  * No tourniquets in log *  DICTATION: .Dragon Dictation this is a 32 year old female who was diagnosed with stage II-B cervical cancer. She initially underwent external beam radiation therapy along with radiosensitizing chemotherapy. Patient is now taken to the operating room for her first tandem and ring insertion in preparation for her first high-dose-rate treatment with iridium 192.   Patient was prepped and draped in the usual sterile fashion. Gen. anesthesia was used with LMA insertion.   A Foley catheter was inserted. On exam under anesthesia the patient was noted to have a good response to her external beam and radiosensitizing chemotherapy. The cervix was estimated to be approximately 3.5 by 4 cm in size. There was no necrotic tumor noted at the cervix at this time. There was no obvious extension to the vaginal vault.  The patient had approximately 250 cc of sterile water placed in the bladder for imaging purposes. The patient then underwent uterine sounding and dilation. The uterus was estimated to be approximately 8-1/2-9 cm in length. Good images were obtained on ultrasound. The uterus was anteverted. Under ultrasound guidance the patient had a 60 mm tandem placed within the uterus. This was the 30 tandem with the maximum curvature to fit the anteverted uterus. Patient then had a 30 34 mm ring  placed within the proximal vagina. A rectal paddle was then placed. On ultrasound imaging the tandem was good position within the center of the uterus on sagittal and transverse images. The patient tolerated the procedure well. After recovery in anesthesia the patient will be transported to the Radiation Oncology Department for planning and treatment. She will be discharged  later in the day after her radiation treatment is complete.  PLAN OF CARE: Transferred to radiation oncology once stable for planning and treatment  PATIENT DISPOSITION:  PACU - hemodynamically stable.   Delay start of Pharmacological VTE agent (>24hrs) due to surgical blood loss or risk of bleeding: not applicable

## 2012-05-22 NOTE — Progress Notes (Signed)
   Department of Radiation Oncology  Phone:  [phone] Fax:        [phone]   Simulation note  Patient was brought down from the outpatient surgical center. She was placed on the CT simulator table. The Foley catheter was accessed and contrast was placed in the bladder. She also had a rectal tube placed with contrast instilled into the rectal vault. A fiducial markers were placed within the ring tandem system. Patient proceeded to undergo CT scan in the treatment position. Patient will undergo contouring of the ring tandem as well as bladder rectum sigmoid colon and small bowel. She will proceed with her first high-dose-rate treatment with a dose of approximately 6 Gy  to point A.

## 2012-05-22 NOTE — Interval H&P Note (Signed)
History and Physical Interval Note:  05/22/2012 7:56 AM  Peggy Pitts  has presented today for surgery, with the diagnosis of CERVICAL CANCER  The various methods of treatment have been discussed with the patient and family. After consideration of risks, benefits and other options for treatment, the patient has consented to  Procedure(s) (LRB) with comments: TANDEM RING INSERTION (N/A) as a surgical intervention .  The patient's history has been reviewed, patient examined, no change in status, stable for surgery.  I have reviewed the patient's chart and labs.  Questions were answered to the patient's satisfaction.     Antony Blackbird D

## 2012-05-22 NOTE — H&P (View-Only) (Signed)
Radiation Oncology         [phone] ________________________________  Initial outpatient Consultation  Name: Peggy Pitts MRN: [id number]  Date: 05/16/2012  DOB: 08/24/1980   Pre-operative note  DIAGNOSIS: The encounter diagnosis was Cervical cancer.  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 female who is late last year was diagnosed with stage II-B cervical cancer. She proceeded to undergo therapy with external beam radiation treatments as well as radiosensitizing chemotherapy. She was originally scheduled for her first brachytherapy procedure approximately 1-1/2 weeks ago, however the patient developed severe diarrhea and was found to have C. difficile infection. Her first 2 brachy therapy procedures were therefore canceled.   the patient will complete her external beam radiation therapy tomorrow. She is scheduled for her first brachytherapy procedure on January 27.   PAST MEDICAL HISTORY:  has a past medical history of Cervical cancer; Diarrhea; Hypertension; Asthma; Anemia; Weakness generalized; and Nausea.    PAST SURGICAL HISTORY: Past Surgical History  Procedure Date  . Tubal ligation 2006  . Cervical biopsy 02/28/2012    poorly differentiated squamous cell ca    FAMILY HISTORY: family history includes Diabetes in her mother and Hypertension in her mother.  SOCIAL HISTORY:  reports that she has never smoked. She has never used smokeless tobacco. She reports that she drinks alcohol. She reports that she does not use illicit drugs.  ALLERGIES: Shellfish allergy and Adhesive  MEDICATIONS:  Current Outpatient Prescriptions  Medication Sig Dispense Refill  . BIOTIN PO Take 1 tablet by mouth 2 (two) times a week.      . ferrous gluconate (FERGON) 324 MG tablet Take daily on empty stomach with OJ  30 tablet  4  . LORazepam (ATIVAN) 1 MG tablet Take 0.5 to 1 mg under tongue or swallow every 6  hours as needed for nausea.  Will make drowsy.  30 tablet  0  . metroNIDAZOLE (FLAGYL) 500 MG tablet Take 1 tablet (500 mg total) by mouth 3 (three) times daily. Take at least 2 tablets today. Take exactly as scheduled  42 tablet  0  . metroNIDAZOLE (METROGEL) 0.75 % gel Apply topically at bedtime as needed. Apply to vagina  At bedtime insteat of douche.  45 g  1  . naproxen sodium (ANAPROX) 220 MG tablet Take 220 mg by mouth 2 (two) times daily as needed.      . ondansetron (ZOFRAN) 8 MG tablet Take 1-2 tablets (8-16 mg total) by mouth every 8 (eight) hours as needed for nausea (Non drowsy).  30 tablet  1  . potassium chloride (K-DUR) 10 MEQ tablet Take 1 by mouth daily  30 tablet  0   No current facility-administered medications for this encounter.   Facility-Administered Medications Ordered in Other Encounters  Medication Dose Route Frequency Provider Last Rate Last Dose  . 0.9 %  sodium chloride infusion  250 mL Intravenous Once Lennis P Livesay, MD      . acetaminophen (TYLENOL) tablet 650 mg  650 mg Oral Once Lennis P Livesay, MD        REVIEW OF SYSTEMS:  A 15 point review of systems is documented in the electronic medical record. This was obtained by the nursing staff. However, I reviewed this with the patient to discuss relevant findings and make appropriate changes.  She denies any more diarrhea. She will complete her Flagyl prescription this afternoon. She denies any vaginal bleeding. She has minimal   vaginal discharge at this time. She denies any urination difficulties.   PHYSICAL EXAM:  weight is 217 lb 6.4 oz (98.612 kg). Her blood pressure is 137/82 and her pulse is 115. Her respiration is 18.   BP 137/82  Pulse 115  Resp 18  Wt 217 lb 6.4 oz (98.612 kg)  General Appearance:    Alert, cooperative, no distress, appears stated age  Head:    Normocephalic, without obvious abnormality, atraumatic  Eyes:    PERRL, conjunctiva/corneas clear, EOM's intact     Nose:   Nares normal,  septum midline, mucosa normal, no drainage    or sinus tenderness  Throat:   Lips, mucosa, and tongue normal; teeth and gums normal  Neck:   Supple, symmetrical, trachea midline, no adenopathy;    thyroid:  no enlargement/tenderness/nodules; no carotid   bruit or JVD  Back:     Symmetric, no curvature, ROM normal, no CVA tenderness  Lungs:     Clear to auscultation bilaterally, respirations unlabored  Chest Wall:    No tenderness or deformity   Heart:    Regular rate and rhythm, no murmur, rub   or gallop     Abdomen:     Soft, non-tender, bowel sounds active all four quadrants,    no masses, no organomegaly  Genitalia:   will be performed in the operating room   Rectal:    will be performed in the operating room    Extremities:   Extremities normal, atraumatic, no cyanosis or edema  Pulses:   2+ and symmetric all extremities  Skin:   Skin color, texture, turgor normal, no rashes or lesions  Lymph nodes:   Cervical, supraclavicular, and axillary nodes normal  Neurologic:   CNII-XII intact, normal strength, sensation and reflexes    throughout    LABORATORY DATA:  Lab Results  Component Value Date   WBC 2.8* 05/08/2012   HGB 11.2* 05/08/2012   HCT 33.6* 05/08/2012   MCV 70.7* 05/08/2012   PLT 122* 05/08/2012   Lab Results  Component Value Date   NA 135* 05/08/2012   K 3.3* 05/08/2012   CL 99 05/08/2012   CO2 25 05/08/2012   Lab Results  Component Value Date   ALT 21 05/05/2012   AST 16 05/05/2012   ALKPHOS 79 05/05/2012   BILITOT 0.30 05/05/2012         IMPRESSION: 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 She will receive 4 subsequent treatments on a weekly basis.     ------------------------------------------------  -----------------------------------  Klarissa Mcilvain D. Jowan Skillin, PhD, MD         

## 2012-05-22 NOTE — Progress Notes (Signed)
   Department of Radiation Oncology  Phone:  [phone] Fax:        [phone]   High-dose-rate brachii therapy procedure note  After planning was complete the patient was transferred to the high dose rate suite.  Simple treatment device note While in the operating room the patient had construction of her custom ring tandem system.  Verification simulation note  An AP and lateral film was obtained in the treatment position.   this was compared to planning films earlier in the day documenting good position of the tandem and ring system for treatment  High-dose-rate brachii therapy note  Patient proceeded to undergo her first high-dose-rate treatment. The remote afterloading device was attached to the ring tandem system. The patient was prescribed a dose of 6 Gy to be delivered to [facility] A . This was achieved with a total dwell time of 605.1 seconds.   patient was treated with 2 channels using 8 dwell positions in the ring system and 8 dwell positions in the tandem. Patient tolerated the procedure well. After completion of her therapy a radiation survey was performed documenting return of the iridium source into the Nucletron safe.  -----------------------------------  Billie Lade, PhD, MD

## 2012-05-22 NOTE — Anesthesia Procedure Notes (Signed)
Procedure Name: LMA Insertion Date/Time: 05/22/2012 8:45 AM Performed by: Fran Lowes Pre-anesthesia Checklist: Patient identified, Emergency Drugs available, Suction available and Patient being monitored Patient Re-evaluated:Patient Re-evaluated prior to inductionOxygen Delivery Method: Circle System Utilized Preoxygenation: Pre-oxygenation with 100% oxygen Intubation Type: IV induction Ventilation: Mask ventilation without difficulty LMA: LMA inserted LMA Size: 4.0 Number of attempts: 1 Airway Equipment and Method: bite block Placement Confirmation: positive ETCO2 Tube secured with: Tape Dental Injury: Teeth and Oropharynx as per pre-operative assessment

## 2012-05-23 ENCOUNTER — Encounter (HOSPITAL_BASED_OUTPATIENT_CLINIC_OR_DEPARTMENT_OTHER): Payer: Self-pay | Admitting: Radiation Oncology

## 2012-05-24 ENCOUNTER — Encounter (HOSPITAL_BASED_OUTPATIENT_CLINIC_OR_DEPARTMENT_OTHER): Payer: Self-pay | Admitting: *Deleted

## 2012-05-24 ENCOUNTER — Encounter: Payer: Self-pay | Admitting: Radiation Oncology

## 2012-05-24 NOTE — Progress Notes (Signed)
Pt instructed npo p mn 2/2 x potassium, ativan, zofran w sip of water.  To [facility] 2/3 @ 0615.  Needs hgb on arrival

## 2012-05-24 NOTE — Addendum Note (Signed)
Encounter addended by: Agnes Lawrence, RN on: 05/24/2012  3:50 PM<BR>     Documentation filed: Notes Section, Inpatient Document Flowsheet, Vitals Section

## 2012-05-24 NOTE — Progress Notes (Signed)
Late entry from 05/22/2012. Received patient from post op at 0930 with 250 cc of clear yellow urine in foley collection bag. Patient completed 1000 cc bag of lactated ringers at 1302 today that was started prior to surgery. Hung another 1000 cc bag of lactated ringers at 1302. 500 cc of 1000 cc bag of lactated ringer infused prior to d/c at 1530.

## 2012-05-24 NOTE — Progress Notes (Signed)
Late entry from 05/22/2012 at 1530. Patient resting supine on stretcher. Patient alert and oriented to person, place, and time. No distress noted. Pleasant affect noted. Patient denies pain at this time. Discontinued IV fluids. Discontinued IV per protocol. Catheter intact upon removal. Applied an occlusive dressing to old IV site. Patient tolerated well. Drained 1400 cc of clear yellow urine from collection bag. Discontinued foley catheter. Patient tolerated well. Assisted Dr. Roselind Messier with removal of tandem and ovoid. Patient tolerated well. Assisted patient while dressing in street clothes. Wheeled patient to lobby in wheelchair for discharge.

## 2012-05-26 ENCOUNTER — Other Ambulatory Visit: Payer: Self-pay | Admitting: Oncology

## 2012-05-26 ENCOUNTER — Other Ambulatory Visit (HOSPITAL_BASED_OUTPATIENT_CLINIC_OR_DEPARTMENT_OTHER): Payer: No Typology Code available for payment source | Admitting: Lab

## 2012-05-26 ENCOUNTER — Ambulatory Visit (HOSPITAL_BASED_OUTPATIENT_CLINIC_OR_DEPARTMENT_OTHER): Payer: No Typology Code available for payment source | Admitting: Physician Assistant

## 2012-05-26 ENCOUNTER — Encounter: Payer: Self-pay | Admitting: Physician Assistant

## 2012-05-26 VITALS — BP 130/80 | HR 107 | Temp 98.3°F | Resp 20 | Ht 66.0 in | Wt 218.4 lb

## 2012-05-26 DIAGNOSIS — E876 Hypokalemia: Secondary | ICD-10-CM

## 2012-05-26 DIAGNOSIS — D509 Iron deficiency anemia, unspecified: Secondary | ICD-10-CM

## 2012-05-26 DIAGNOSIS — C539 Malignant neoplasm of cervix uteri, unspecified: Secondary | ICD-10-CM

## 2012-05-26 DIAGNOSIS — T451X5A Adverse effect of antineoplastic and immunosuppressive drugs, initial encounter: Secondary | ICD-10-CM

## 2012-05-26 LAB — CBC WITH DIFFERENTIAL/PLATELET
Basophils Absolute: 0 10*3/uL (ref 0.0–0.1)
EOS%: 1.1 % (ref 0.0–7.0)
HCT: 29.7 % — ABNORMAL LOW (ref 34.8–46.6)
HGB: 10.1 g/dL — ABNORMAL LOW (ref 11.6–15.9)
LYMPH%: 34.6 % (ref 14.0–49.7)
MCH: 25 pg — ABNORMAL LOW (ref 25.1–34.0)
MCV: 73.6 fL — ABNORMAL LOW (ref 79.5–101.0)
MONO%: 13.1 % (ref 0.0–14.0)
NEUT%: 51 % (ref 38.4–76.8)
Platelets: 144 10*3/uL — ABNORMAL LOW (ref 145–400)
RDW: 28.1 % — ABNORMAL HIGH (ref 11.2–14.5)

## 2012-05-26 LAB — BASIC METABOLIC PANEL (CC13)
BUN: 6.1 mg/dL — ABNORMAL LOW (ref 7.0–26.0)
Calcium: 9.3 mg/dL (ref 8.4–10.4)
Creatinine: 0.9 mg/dL (ref 0.6–1.1)

## 2012-05-26 LAB — MAGNESIUM: Magnesium: 1.2 mg/dL — ABNORMAL LOW (ref 1.5–2.5)

## 2012-05-26 NOTE — Patient Instructions (Addendum)
Followup with Dr. Darrold Span as previously scheduled

## 2012-05-28 NOTE — Anesthesia Preprocedure Evaluation (Addendum)
Anesthesia Evaluation  Patient identified by MRN, date of birth, ID band Patient awake    Reviewed: Allergy & Precautions, H&P , NPO status , Patient's Chart, lab work & pertinent test results  Airway Mallampati: II TM Distance: >3 FB Neck ROM: full    Dental No notable dental hx. (+) Teeth Intact and Dental Advisory Given   Pulmonary neg pulmonary ROS,  breath sounds clear to auscultation  Pulmonary exam normal       Cardiovascular Exercise Tolerance: Good hypertension, negative cardio ROS  Rhythm:regular Rate:Normal     Neuro/Psych negative neurological ROS  negative psych ROS   GI/Hepatic negative GI ROS, Neg liver ROS,   Endo/Other  negative endocrine ROS  Renal/GU negative Renal ROS  negative genitourinary   Musculoskeletal   Abdominal   Peds  Hematology negative hematology ROS (+)   Anesthesia Other Findings   Reproductive/Obstetrics negative OB ROS Cervical Ca.                          Anesthesia Physical Anesthesia Plan  ASA: II  Anesthesia Plan: General   Post-op Pain Management:    Induction: Intravenous  Airway Management Planned: LMA  Additional Equipment:   Intra-op Plan:   Post-operative Plan:   Informed Consent: I have reviewed the patients History and Physical, chart, labs and discussed the procedure including the risks, benefits and alternatives for the proposed anesthesia with the patient or authorized representative who has indicated his/her understanding and acceptance.   Dental Advisory Given  Plan Discussed with: CRNA and Surgeon  Anesthesia Plan Comments:         Anesthesia Quick Evaluation

## 2012-05-28 NOTE — Progress Notes (Signed)
OFFICE PROGRESS NOTE   05/28/2012   Physicians: P.Gehrig, J.Debroah Loop, J.Kinard  INTERVAL HISTORY:   Patient is seen, alone for visit, in follow up of treatment for IIB poorly differentiated cervical carcinoma, radiation therapy ongoing and course to date complicated by C.difficile diarrhea. The C.diff was documented on 05-03-12, after what was initially thought to be RT related diarrhea became progressively more severe. She has had hypokalemia and hypomagnesemia related to cisplatin and the infectious diarrhea, which has required IV and po supplementation. She's completed her course of Flagyl. She has had no further episodes of diarrhea. She is tolerating magnesium 400 mg once daily. She completed external beam radiation on 05/17/2012; she had 3 treatments of sensitizing cisplatin on 12-16, 12-30 and 05-01-12,with delays and held after 05-01-12 due to C.diff diarrhea. First two planned HDR treatments were also delayed due to the C.diff infection, with first HDR now planned for 05-22-12.    Patient presented to ED 01-11-12 with complaints of foul vaginal odor x 2 weeks, cultures negative for chlamydia and GC and treatment given with rocephin and zithromax. She returned to ED on 02-22-12 with ongoing problem, with finding of large mass obstructing cervical os. She was referred to Dr Scheryl Darter, with hx of last PAP several years previously; biopsy of the cervical mass showed invasive poorly differentiated squamous cell carcinoma (path [id number]). She had transabdominal and transvaginal US during this evaluation. CT CAP 03-13-12 had probable residual thymic tissue in mediastinum, 7 cm cervical mass with poor fat plane between this and posterior bladder, bilateral internal iliac and left pelvic sidewall adenopathy. She saw Dr Duard Brady on 03-15-12, clinical stage IIB.  PET 03-22-12, wih uptake in retroperitoneal and pelvic nodes and the cervical mass; she also had mild fullness of right renal collecting system. She has  had external beam RT and sensitizing CDDP as above.   She voiced no specific complaints today She has had no bleeding and no fever or other concerns for infection.  Remainder of 10 point Review of Systems negative.  Objective:  Vital signs in last 24 hours:  BP 130/80  Pulse 107  Temp 98.3 F (36.8 C) (Oral)  Resp 20  Ht 5\' 6"  (1.676 m)  Wt 218 lb 6.4 oz (99.066 kg)  BMI 35.25 kg/m2 Weight is down 8.5 lbs from late Dec. Easily ambulatory, looks the most comfortable that I have seen her in weeks. Respirations not labored RA.   HEENT:PERRLA, extra ocular movement intact, sclera clear, anicteric and oropharynx clear, no lesions LymphaticsCervical, supraclavicular, and axillary nodes normal. Resp: clear to auscultation bilaterally and normal percussion bilaterally Cardio: regular rate and rhythm GI: soft, not distended, bowel sounds normally active, not tender, no HSM appreciated Extremities: extremities normal, atraumatic, no cyanosis or edema Neuro:no sensory deficits noted Breast:normal without suspicious masses, skin or nipple changes or axillary nodes   Lab Results:  Results for orders placed in visit on 05/26/12  CBC WITH DIFFERENTIAL      Component Value Range   WBC 2.9 (*) 3.9 - 10.3 10e3/uL   NEUT# 1.5  1.5 - 6.5 10e3/uL   HGB 10.1 (*) 11.6 - 15.9 g/dL   HCT 14.7 (*) 82.9 - 56.2 %   Platelets 144 (*) 145 - 400 10e3/uL   MCV 73.6 (*) 79.5 - 101.0 fL   MCH 25.0 (*) 25.1 - 34.0 pg   MCHC 34.0  31.5 - 36.0 g/dL   RBC 1.30  8.65 - 7.84 10e6/uL   RDW 28.1 (*) 11.2 - 14.5 %  lymph# 1.0  0.9 - 3.3 10e3/uL   MONO# 0.4  0.1 - 0.9 10e3/uL   Eosinophils Absolute 0.0  0.0 - 0.5 10e3/uL   Basophils Absolute 0.0  0.0 - 0.1 10e3/uL   NEUT% 51.0  38.4 - 76.8 %   LYMPH% 34.6  14.0 - 49.7 %   MONO% 13.1  0.0 - 14.0 %   EOS% 1.1  0.0 - 7.0 %   BASO% 0.2  0.0 - 2.0 %  BASIC METABOLIC PANEL (CC13)      Component Value Range   Sodium 137  136 - 145 mEq/L   Potassium 3.7  3.5  - 5.1 mEq/L   Chloride 101  98 - 107 mEq/L   CO2 26  22 - 29 mEq/L   Glucose 96  70 - 99 mg/dl   BUN 6.1 (*) 7.0 - 16.1 mg/dL   Creatinine 0.9  0.6 - 1.1 mg/dL   Calcium 9.3  8.4 - 09.6 mg/dL  MAGNESIUM      Component Value Range   Magnesium 1.2 (*) 1.5 - 2.5 mg/dL    Discussed labs above. She will go directly to [facility] now, to resume K+ now (20 mEq x 3 today then bid until next labs checked) + start oral magnesium 400 mg daily.  Studies/Results:  No results found.  Medications: I have reviewed the patient's current medications. Patient discussed with Dr. Darrold Span  Assessment/Plan:  1.poorly differentiated squamous cell carcinoma of cervix: treatment course as above, HDR to begin 05-22-12. Mild pancytopenia related to recent chemo, extended field RT, iron deficiency anemia. She'll followup with Dr. Darrold Span as previously scheduled.  2.hypokalemia and hypomagnesemia: related to CDDP and C diff/ RT diarrhea. Hypokalemia resolved.We will recheck Bmet and Mg on 2-7 at [facility]; fine for these to be rechecked with HDR treatments per Dr Roselind Messier anesthesia. 3.C.diff diarrhea: Resolved status post treatment with Flagyl as above.   Conni Slipper, PA-C   05/28/2012, 10:46 PM

## 2012-05-29 ENCOUNTER — Ambulatory Visit (HOSPITAL_BASED_OUTPATIENT_CLINIC_OR_DEPARTMENT_OTHER): Payer: No Typology Code available for payment source | Admitting: Anesthesiology

## 2012-05-29 ENCOUNTER — Encounter (HOSPITAL_BASED_OUTPATIENT_CLINIC_OR_DEPARTMENT_OTHER): Payer: Self-pay | Admitting: Anesthesiology

## 2012-05-29 ENCOUNTER — Encounter: Payer: Self-pay | Admitting: Radiation Oncology

## 2012-05-29 ENCOUNTER — Ambulatory Visit (HOSPITAL_BASED_OUTPATIENT_CLINIC_OR_DEPARTMENT_OTHER)
Admission: RE | Admit: 2012-05-29 | Discharge: 2012-05-29 | Disposition: A | Discharge status: Home / Self Care | Payer: No Typology Code available for payment source | Source: Ambulatory Visit | Attending: Radiation Oncology | Admitting: Radiation Oncology

## 2012-05-29 ENCOUNTER — Encounter (HOSPITAL_BASED_OUTPATIENT_CLINIC_OR_DEPARTMENT_OTHER): Payer: Self-pay | Admitting: *Deleted

## 2012-05-29 ENCOUNTER — Ambulatory Visit (HOSPITAL_COMMUNITY)
Admission: RE | Admit: 2012-05-29 | Discharge: 2012-05-29 | Disposition: A | Discharge status: Home / Self Care | Payer: No Typology Code available for payment source | Source: Ambulatory Visit | Attending: Radiation Oncology | Admitting: Radiation Oncology

## 2012-05-29 ENCOUNTER — Encounter (HOSPITAL_BASED_OUTPATIENT_CLINIC_OR_DEPARTMENT_OTHER)
Admission: RE | Disposition: A | Discharge status: Home / Self Care | Payer: Self-pay | Source: Ambulatory Visit | Attending: Radiation Oncology

## 2012-05-29 ENCOUNTER — Ambulatory Visit
Admission: RE | Admit: 2012-05-29 | Discharge: 2012-05-29 | Disposition: A | Discharge status: Home / Self Care | Payer: Medicaid Other | Source: Ambulatory Visit | Attending: Radiation Oncology | Admitting: Radiation Oncology

## 2012-05-29 VITALS — BP 120/74 | HR 70 | Temp 97.8°F

## 2012-05-29 DIAGNOSIS — J45909 Unspecified asthma, uncomplicated: Secondary | ICD-10-CM | POA: Insufficient documentation

## 2012-05-29 DIAGNOSIS — I10 Essential (primary) hypertension: Secondary | ICD-10-CM | POA: Insufficient documentation

## 2012-05-29 DIAGNOSIS — C539 Malignant neoplasm of cervix uteri, unspecified: Secondary | ICD-10-CM

## 2012-05-29 DIAGNOSIS — Z79899 Other long term (current) drug therapy: Secondary | ICD-10-CM | POA: Insufficient documentation

## 2012-05-29 HISTORY — PX: TANDEM RING INSERTION: SHX6199

## 2012-05-29 LAB — POCT I-STAT 4, (NA,K, GLUC, HGB,HCT)
Glucose, Bld: 106 mg/dL — ABNORMAL HIGH (ref 70–99)
HCT: 34 % — ABNORMAL LOW (ref 36.0–46.0)
Potassium: 4.5 mEq/L (ref 3.5–5.1)

## 2012-05-29 SURGERY — INSERTION, UTERINE TANDEM AND RING OR CYLINDER, FOR BRACHYTHERAPY
Anesthesia: General | Site: Cervix | Wound class: Clean Contaminated

## 2012-05-29 MED ORDER — DEXAMETHASONE SODIUM PHOSPHATE 4 MG/ML IJ SOLN
INTRAMUSCULAR | Status: DC | PRN
  Administered 2012-05-29: 8 mg via INTRAVENOUS

## 2012-05-29 MED ORDER — ONDANSETRON HCL 4 MG/2ML IJ SOLN
INTRAMUSCULAR | Status: DC | PRN
  Administered 2012-05-29: 4 mg via INTRAVENOUS

## 2012-05-29 MED ORDER — LACTATED RINGERS IV SOLN
INTRAVENOUS | Status: DC
  Administered 2012-05-29: 07:00:00 via INTRAVENOUS
  Filled 2012-05-29 (×2): qty 1000

## 2012-05-29 MED ORDER — PROPOFOL 10 MG/ML IV BOLUS
INTRAVENOUS | Status: DC | PRN
  Administered 2012-05-29: 180 mg via INTRAVENOUS

## 2012-05-29 MED ORDER — FENTANYL CITRATE 0.05 MG/ML IJ SOLN
25.0000 ug | INTRAMUSCULAR | Status: DC | PRN
  Filled 2012-05-29: qty 1

## 2012-05-29 MED ORDER — WATER FOR IRRIGATION, STERILE IR SOLN
Status: DC | PRN
  Administered 2012-05-29: 250 mL via INTRAVESICAL

## 2012-05-29 MED ORDER — FENTANYL CITRATE 0.05 MG/ML IJ SOLN
INTRAMUSCULAR | Status: DC | PRN
  Administered 2012-05-29: 50 ug via INTRAVENOUS

## 2012-05-29 MED ORDER — MIDAZOLAM HCL 5 MG/5ML IJ SOLN
INTRAMUSCULAR | Status: DC | PRN
  Administered 2012-05-29: 2 mg via INTRAVENOUS

## 2012-05-29 MED ORDER — LACTATED RINGERS IV SOLN
INTRAVENOUS | Status: DC
  Filled 2012-05-29: qty 1000

## 2012-05-29 MED ORDER — LACTATED RINGERS IV SOLN
Freq: Once | INTRAVENOUS | Status: DC
  Filled 2012-05-29: qty 1000

## 2012-05-29 MED ORDER — LIDOCAINE HCL (CARDIAC) 20 MG/ML IV SOLN
INTRAVENOUS | Status: DC | PRN
  Administered 2012-05-29: 60 mg via INTRAVENOUS

## 2012-05-29 SURGICAL SUPPLY — 33 items
BAG URINE DRAINAGE (UROLOGICAL SUPPLIES) ×2 IMPLANT
BANDAGE CONFORM 2  STR LF (GAUZE/BANDAGES/DRESSINGS) IMPLANT
CATH FOLEY 2WAY SLVR  5CC 16FR (CATHETERS) ×1
CATH FOLEY 2WAY SLVR 5CC 16FR (CATHETERS) ×1 IMPLANT
CLOTH BEACON ORANGE TIMEOUT ST (SAFETY) ×2 IMPLANT
COVER TABLE BACK 60X90 (DRAPES) ×2 IMPLANT
DRAPE LG THREE QUARTER DISP (DRAPES) ×2 IMPLANT
DRAPE UNDERBUTTOCKS STRL (DRAPE) ×2 IMPLANT
DRSG PAD ABDOMINAL 8X10 ST (GAUZE/BANDAGES/DRESSINGS) ×1 IMPLANT
GLOVE BIO SURGEON STRL SZ 6.5 (GLOVE) ×3 IMPLANT
GLOVE BIO SURGEON STRL SZ7.5 (GLOVE) ×2 IMPLANT
GLOVE ECLIPSE 6.0 STRL STRAW (GLOVE) ×1 IMPLANT
GOWN PREVENTION PLUS LG XLONG (DISPOSABLE) ×4 IMPLANT
HOLDER FOLEY CATH W/STRAP (MISCELLANEOUS) ×2 IMPLANT
LEGGING LITHOTOMY PAIR STRL (DRAPES) ×2 IMPLANT
NDL SPNL 22GX3.5 QUINCKE BK (NEEDLE) IMPLANT
NEEDLE SPNL 22GX3.5 QUINCKE BK (NEEDLE) IMPLANT
PACK BASIN DAY SURGERY FS (CUSTOM PROCEDURE TRAY) ×2 IMPLANT
PAD OB MATERNITY 4.3X12.25 (PERSONAL CARE ITEMS) ×2 IMPLANT
PAD PREP 24X48 CUFFED NSTRL (MISCELLANEOUS) ×2 IMPLANT
PLUG CATH AND CAP STER (CATHETERS) ×2 IMPLANT
SET IRRIG Y TYPE TUR BLADDER L (SET/KITS/TRAYS/PACK) ×2 IMPLANT
SUT PDS AB 0 CT1 36 (SUTURE) IMPLANT
SUT PROLENE 0 SH 30 (SUTURE) IMPLANT
SUT SILK 2 0 30  PSL (SUTURE)
SUT SILK 2 0 30 PSL (SUTURE) IMPLANT
SYR BULB IRRIGATION 50ML (SYRINGE) IMPLANT
SYR CONTROL 10ML LL (SYRINGE) IMPLANT
SYRINGE 10CC LL (SYRINGE) ×2 IMPLANT
TOWEL OR 17X24 6PK STRL BLUE (TOWEL DISPOSABLE) ×4 IMPLANT
TRAY DSU PREP LF (CUSTOM PROCEDURE TRAY) ×2 IMPLANT
WATER STERILE IRR 3000ML UROMA (IV SOLUTION) ×2 IMPLANT
WATER STERILE IRR 500ML POUR (IV SOLUTION) ×2 IMPLANT

## 2012-05-29 NOTE — Brief Op Note (Signed)
05/29/2012  8:12 AM  PATIENT:  Peggy Pitts  32 y.o. female  PRE-OPERATIVE DIAGNOSIS:  CERVICAL CANCER  POST-OPERATIVE DIAGNOSIS:  CERVICAL CANCER  PROCEDURE:  Procedure(s) (LRB) with comments: TANDEM RING INSERTION (N/A)  SURGEON:  Surgeon(s) and Role:    * Billie Lade, MD - Primary  PHYSICIAN ASSISTANT:   ASSISTANTS: none   ANESTHESIA:   general  EBL:  Total I/O In: 500 [I.V.:500] Out: -   BLOOD ADMINISTERED:none  DRAINS: Urinary Catheter (Foley)   LOCAL MEDICATIONS USED:  NONE  SPECIMEN:  No Specimen  DISPOSITION OF SPECIMEN:  N/A  COUNTS:  YES  TOURNIQUET:  * No tourniquets in log *  DICTATION:  This is a 32 year old female who was diagnosed with stage II-B cervical cancer. She initially underwent external beam radiation therapy along with radiosensitizing chemotherapy. Patient is now taken to the operating room for her second tandem and ring insertion in preparation for her first high-dose-rate treatment with iridium 192.  Patient was prepped and draped in the usual sterile fashion. Gen. anesthesia was used with LMA insertion. A Foley catheter was inserted. On exam under anesthesia the patient was noted to have a good response to her external beam and radiosensitizing chemotherapy. The cervix was estimated to be approximately 3.5 by 4 cm in size. There was no necrotic tumor noted at the cervix at this time. Erythema noted at the 3:00 position of the cervix.  There was no obvious extension to the vaginal vault. The patient had approximately 200 cc of sterile water placed in the bladder for imaging purposes. The patient then underwent uterine sounding and dilation. The uterus was estimated to be approximately 8-1/2-9 cm in length. Good images were obtained on ultrasound. The uterus was anteverted. Under ultrasound guidance the patient had a 60 mm tandem placed within the uterus. This was the 30 tandem with the maximum curvature to fit the anteverted uterus. Patient  then had a 30 34 mm ring placed within the proximal vagina. A rectal paddle was then placed. On ultrasound imaging the tandem was good position within the center of the uterus on sagittal and transverse images. The patient tolerated the procedure well. After recovery in anesthesia the patient will be transported to the Radiation Oncology Department for planning and treatment. She will be discharged later in the day after her radiation treatment is complete.      PLAN OF CARE:Transferred to radiation oncology once stable for planning and treatment      PATIENT DISPOSITION:  PACU - hemodynamically stable.   Delay start of Pharmacological VTE agent (>24hrs) due to surgical blood loss or risk of bleeding: not applicable

## 2012-05-29 NOTE — Interval H&P Note (Signed)
History and Physical Interval Note:  05/29/2012 7:40 AM  Peggy Pitts  has presented today for surgery, with the diagnosis of CERVICAL CANCER  The various methods of treatment have been discussed with the patient and family. After consideration of risks, benefits and other options for treatment, the patient has consented to  Procedure(s) (LRB) with comments: TANDEM RING INSERTION (N/A) as a surgical intervention .  The patient's history has been reviewed, patient examined, no change in status, stable for surgery.  I have reviewed the patient's chart and labs.  Questions were answered to the patient's satisfaction.     Antony Blackbird D

## 2012-05-29 NOTE — Progress Notes (Signed)
The HDR device was removed at ~ 4:40pm.  Peggy Pitts did express discomfort at this time but refused pain medication when offered by Dr. Roselind Messier.   Once device removed her foley catheter was removed with 800 ml of clear yellow urine and her IV in her left hand was removed and a small pressure dressing was applied.  She received 700 ml of Lactated Ringers.  Once dressed and up to wheelchair she did not have any  dizziness , pain nor nausea.   [name] discharged to home accompanied by her brother.  Given discharge instructions to call if she has any increased vaginal bleeding, pain, or fever, or difficulty voiding and advised her not to drive or operate any machinery.  Confirmed that a family member will be present in her household for tonight.

## 2012-05-29 NOTE — Progress Notes (Signed)
Patient resting supine in hospital bed watching tv. Patient alert and oriented to person, place, and time. No distress noted. Patient denies nausea or vomiting. Patient denies needs. SCDs noted. Emptied 1200 cc of clear yellow urine from urinary drainage bag. Call bell within reach. Patient understanding to call with needs.

## 2012-05-29 NOTE — Anesthesia Procedure Notes (Signed)
Procedure Name: LMA Insertion Date/Time: 05/29/2012 7:38 AM Performed by: Renella Cunas D Pre-anesthesia Checklist: Patient identified, Emergency Drugs available, Suction available and Patient being monitored Patient Re-evaluated:Patient Re-evaluated prior to inductionOxygen Delivery Method: Circle System Utilized Preoxygenation: Pre-oxygenation with 100% oxygen Intubation Type: IV induction Ventilation: Mask ventilation without difficulty LMA: LMA inserted LMA Size: 4.0 Number of attempts: 1 Airway Equipment and Method: bite block Placement Confirmation: positive ETCO2 Tube secured with: Tape Dental Injury: Teeth and Oropharynx as per pre-operative assessment

## 2012-05-29 NOTE — Transfer of Care (Signed)
Immediate Anesthesia Transfer of Care Note  Patient: Peggy Pitts  Procedure(s) Performed: Procedure(s) (LRB): TANDEM RING INSERTION (N/A)  Patient Location: PACU  Anesthesia Type: General  Level of Consciousness: awake, oriented, sedated and patient cooperative  Airway & Oxygen Therapy: Patient Spontanous Breathing and Patient connected to face mask oxygen  Post-op Assessment: Report given to PACU RN and Post -op Vital signs reviewed and stable  Post vital signs: Reviewed and stable  Complications: No apparent anesthesia complications

## 2012-05-29 NOTE — Addendum Note (Signed)
Encounter addended by: Agnes Lawrence, RN on: 05/29/2012  1:27 PM<BR>     Documentation filed: Notes Section

## 2012-05-29 NOTE — H&P (View-Only) (Signed)
Radiation Oncology         [phone] ________________________________  Initial outpatient Consultation  Name: Peggy Pitts MRN: [id number]  Date: 05/16/2012  DOB: 07-20-1980   Pre-operative note  DIAGNOSIS: The encounter diagnosis was Cervical cancer.  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 female who is late last year was diagnosed with stage II-B cervical cancer. She proceeded to undergo therapy with external beam radiation treatments as well as radiosensitizing chemotherapy. She was originally scheduled for her first brachytherapy procedure approximately 1-1/2 weeks ago, however the patient developed severe diarrhea and was found to have C. difficile infection. Her first 2 brachy therapy procedures were therefore canceled.   the patient will complete her external beam radiation therapy tomorrow. She is scheduled for her first brachytherapy procedure on January 27.   PAST MEDICAL HISTORY:  has a past medical history of Cervical cancer; Diarrhea; Hypertension; Asthma; Anemia; Weakness generalized; and Nausea.    PAST SURGICAL HISTORY: Past Surgical History  Procedure Date  . Tubal ligation 2006  . Cervical biopsy 02/28/2012    poorly differentiated squamous cell ca    FAMILY HISTORY: family history includes Diabetes in her mother and Hypertension in her mother.  SOCIAL HISTORY:  reports that she has never smoked. She has never used smokeless tobacco. She reports that she drinks alcohol. She reports that she does not use illicit drugs.  ALLERGIES: Shellfish allergy and Adhesive  MEDICATIONS:  Current Outpatient Prescriptions  Medication Sig Dispense Refill  . BIOTIN PO Take 1 tablet by mouth 2 (two) times a week.      . ferrous gluconate (FERGON) 324 MG tablet Take daily on empty stomach with OJ  30 tablet  4  . LORazepam (ATIVAN) 1 MG tablet Take 0.5 to 1 mg under tongue or swallow every 6  hours as needed for nausea.  Will make drowsy.  30 tablet  0  . metroNIDAZOLE (FLAGYL) 500 MG tablet Take 1 tablet (500 mg total) by mouth 3 (three) times daily. Take at least 2 tablets today. Take exactly as scheduled  42 tablet  0  . metroNIDAZOLE (METROGEL) 0.75 % gel Apply topically at bedtime as needed. Apply to vagina  At bedtime insteat of douche.  45 g  1  . naproxen sodium (ANAPROX) 220 MG tablet Take 220 mg by mouth 2 (two) times daily as needed.      . ondansetron (ZOFRAN) 8 MG tablet Take 1-2 tablets (8-16 mg total) by mouth every 8 (eight) hours as needed for nausea (Non drowsy).  30 tablet  1  . potassium chloride (K-DUR) 10 MEQ tablet Take 1 by mouth daily  30 tablet  0   No current facility-administered medications for this encounter.   Facility-Administered Medications Ordered in Other Encounters  Medication Dose Route Frequency Provider Last Rate Last Dose  . 0.9 %  sodium chloride infusion  250 mL Intravenous Once Lennis Buzzy Han, MD      . acetaminophen (TYLENOL) tablet 650 mg  650 mg Oral Once Lennis Buzzy Han, MD        REVIEW OF SYSTEMS:  A 15 point review of systems is documented in the electronic medical record. This was obtained by the nursing staff. However, I reviewed this with the patient to discuss relevant findings and make appropriate changes.  She denies any more diarrhea. She will complete her Flagyl prescription this afternoon. She denies any vaginal bleeding. She has minimal  vaginal discharge at this time. She denies any urination difficulties.   PHYSICAL EXAM:  weight is 217 lb 6.4 oz (98.612 kg). Her blood pressure is 137/82 and her pulse is 115. Her respiration is 18.   BP 137/82  Pulse 115  Resp 18  Wt 217 lb 6.4 oz (98.612 kg)  General Appearance:    Alert, cooperative, no distress, appears stated age  Head:    Normocephalic, without obvious abnormality, atraumatic  Eyes:    PERRL, conjunctiva/corneas clear, EOM's intact     Nose:   Nares normal,  septum midline, mucosa normal, no drainage    or sinus tenderness  Throat:   Lips, mucosa, and tongue normal; teeth and gums normal  Neck:   Supple, symmetrical, trachea midline, no adenopathy;    thyroid:  no enlargement/tenderness/nodules; no carotid   bruit or JVD  Back:     Symmetric, no curvature, ROM normal, no CVA tenderness  Lungs:     Clear to auscultation bilaterally, respirations unlabored  Chest Wall:    No tenderness or deformity   Heart:    Regular rate and rhythm, no murmur, rub   or gallop     Abdomen:     Soft, non-tender, bowel sounds active all four quadrants,    no masses, no organomegaly  Genitalia:   will be performed in the operating room   Rectal:    will be performed in the operating room    Extremities:   Extremities normal, atraumatic, no cyanosis or edema  Pulses:   2+ and symmetric all extremities  Skin:   Skin color, texture, turgor normal, no rashes or lesions  Lymph nodes:   Cervical, supraclavicular, and axillary nodes normal  Neurologic:   CNII-XII intact, normal strength, sensation and reflexes    throughout    LABORATORY DATA:  Lab Results  Component Value Date   WBC 2.8* 05/08/2012   HGB 11.2* 05/08/2012   HCT 33.6* 05/08/2012   MCV 70.7* 05/08/2012   PLT 122* 05/08/2012   Lab Results  Component Value Date   NA 135* 05/08/2012   K 3.3* 05/08/2012   CL 99 05/08/2012   CO2 25 05/08/2012   Lab Results  Component Value Date   ALT 21 05/05/2012   AST 16 05/05/2012   ALKPHOS 79 05/05/2012   BILITOT 0.30 05/05/2012         IMPRESSION: 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 She will receive 4 subsequent treatments on a weekly basis.     ------------------------------------------------  -----------------------------------  Billie Lade, PhD, MD

## 2012-05-29 NOTE — Anesthesia Postprocedure Evaluation (Signed)
  Anesthesia Post-op Note  Patient: Peggy Pitts  Procedure(s) Performed: Procedure(s) (LRB): TANDEM RING INSERTION (N/A)  Patient Location: PACU  Anesthesia Type: General  Level of Consciousness: awake and alert   Airway and Oxygen Therapy: Patient Spontanous Breathing  Post-op Pain: mild  Post-op Assessment: Post-op Vital signs reviewed, Patient's Cardiovascular Status Stable, Respiratory Function Stable, Patent Airway and No signs of Nausea or vomiting  Last Vitals:  Filed Vitals:   05/29/12 0810  BP: 116/74  Pulse:   Temp: 36.9 C  Resp: 16    Post-op Vital Signs: stable   Complications: No apparent anesthesia complications

## 2012-05-29 NOTE — Progress Notes (Signed)
Simulation note  Patient was brought down from the outpatient surgical center. She was placed on the CT simulator table. The Foley catheter was accessed and contrast was placed in the bladder. She also had a rectal tube placed with contrast instilled into the rectal vault. A fiducial markers were placed within the ring tandem system. Patient proceeded to undergo CT scan in the treatment position. Patient will undergo contouring of the ring tandem as well as bladder rectum sigmoid colon and small bowel. She will proceed with her second high-dose-rate treatment with a dose of approximately 6 Gy to point A.      -----------------------------------  Billie Lade, PhD, MD

## 2012-05-29 NOTE — Patient Instructions (Addendum)
Outpatient Surgery Guidelines, Adult  These are general instructions for patients who will be going home the same day as the procedure (outpatient).  LET YOUR CAREGIVER KNOW ABOUT:   Allergies to food or medicine.   Medicines taken, including vitamins, herbs, eyedrops, over-the-counter medicines, and creams.   Use of steroids (by mouth or creams).   Previous problems with anesthetics or numbing medicines.   History of bleeding problems or blood clots.   Previous surgery.   Other health problems, including diabetes and kidney problems.   Possibility of pregnancy, if this applies.  RISKS AND COMPLICATIONS  Your caregiver will discuss possible risks and complications with you before surgery. Common risks and complications include:    Problems due to anesthesia.   Blood loss and replacement (does not apply to minor surgical procedures).   Temporary increase in pain due to surgery.   Uncorrected pain or problems the surgery was meant to correct.   Infection.   New damage.  BEFORE THE PROCEDURE   Stop taking herbal supplements 2 weeks prior to surgery.   Stop smoking at least 2 weeks prior to surgery. This lowers your risk for complications during and after surgery. Ask your caregiver for help with this if needed.   Do not take aspirin for 1 week prior to surgery unless instructed otherwise by your caregiver.   Do not take anti-inflammatory medicines (such as ibuprofen) for 48 hours prior to surgery.   The day before surgery, eat your usual meals and a light supper. Continue fluid intake. Do not drink alcohol.   Do not eat or drink after midnight before your surgery. Take your usual medicine the morning of surgery with a sip of water unless instructed otherwise. Check with your caregiver if you are unsure.    Arrange for someone to take you home from the hospital and to stay with you for 24 hours after the procedure. Medicine given for your procedure may prevent you from driving a car or caring for yourself.   Call your caregiver's office the morning prior to surgery if you develop an illness or problem which may prevent you from safely having your procedure.   You should be present 60 minutes prior to your procedure or as directed.  AFTER THE PROCEDURE  After surgery, you will be taken to the recovery area where a nurse will monitor your progress. When you are awake, stable, taking fluids well, and there are no complications, you will be allowed to go home. You may have numbness around the surgical site. Healing will take some time. You will have tenderness at the surgical site and there may be some swelling and bruising. You may have some nausea.  HOME CARE INSTRUCTIONS   Do not drive for 24 hours or as instructed by your caregiver. Do not drive while taking prescription pain medicines.   Do not drink alcohol for 24 hours.   Do not make important decisions or sign legal documents for 24 hours.   You may resume a normal diet and activities as directed.   Do not lift anything heavier than 10 pounds (4.5 kg) or play contact sports until your caregiver says it is okay.   Change your bandages (dressings) as directed.   Only take over-the-counter or prescription medicines for pain, discomfort, or fever as directed by your caregiver.   Keep all appointments as scheduled and follow all instructions.   Ask questions if you do not understand something.  SEEK MEDICAL CARE IF:   You   have increased bleeding (more than a small spot) from the surgical site.   You have redness, swelling, or increasing pain in the wound.   You see pus coming from the wound.   You have a fever.   You notice a bad smell coming from the wound or dressing.   You feel lightheaded or faint.  SEEK IMMEDIATE MEDICAL CARE IF:    You develop a rash.   You have trouble breathing.   You develop allergies.  Document Released: 01/05/2001 Document Revised: 07/05/2011 Document Reviewed: 11/23/2010  [employer] Patient Information 2013 [employer].

## 2012-05-29 NOTE — Progress Notes (Signed)
1000 cc bag of lactated ringers complete. Began a second bag of LR 1000 cc bag as ordered. Dry intact dressing around IV site noted. No redness, edema or pain of right hand 20 gauge IV. Patient resting comfortably supine in hospital bed eating regular lunch diet.

## 2012-05-29 NOTE — Progress Notes (Signed)
Brought patient to nursing from outpatient surgery center. Patient resting in bed supine. Foley catheter to gravity drain with clear yellow urine. Tandem ovoid equipment in place. Patient alert and oriented to person, place, and time. No distress noted. Pleasant affect noted. Patient denies pain at this time. Lactated ringer infusing via gravity drain into right hand 20 gauge iv. IV site without redness, edema, or pain. IV dressing dry and intact. Patient reports a sore throat. Settled patient in exam room 1 of rad onc nursing station. Provided patient with call bell and encouraged to use for needs. Patient denies pain. Applied SCDs.

## 2012-05-30 ENCOUNTER — Encounter (HOSPITAL_BASED_OUTPATIENT_CLINIC_OR_DEPARTMENT_OTHER): Payer: Self-pay | Admitting: Radiation Oncology

## 2012-05-30 NOTE — Progress Notes (Addendum)
   Department of Radiation Oncology  Phone:  [phone] Fax:        [phone]   High-dose-rate brachii therapy procedure note performed 05/29/2012  After planning was complete the patient was transferred to the high dose rate suite.    Simple treatment device note   While in the operating room the patient had construction of her custom ring tandem system.   Verification simulation note   An AP and lateral film was obtained in the treatment position. this was compared to planning films earlier in the day documenting good position of the tandem and ring system for treatment   High-dose-rate brachii therapy note   Patient proceeded to undergo her second high-dose-rate treatment. The remote afterloading device was attached to the ring tandem system. The patient was prescribed a dose of 6 Gy to be delivered to [facility] A . This was achieved with a total dwell time of 652.7 seconds. patient was treated with 2 channels using 12 dwell positions in the ring system and 7 dwell positions in the tandem. Patient tolerated the procedure well. After completion of her therapy a radiation survey was performed documenting return of the iridium source into the Nucletron safe.  -----------------------------------  Billie Lade, PhD, MD

## 2012-05-31 ENCOUNTER — Encounter (HOSPITAL_BASED_OUTPATIENT_CLINIC_OR_DEPARTMENT_OTHER): Payer: Self-pay | Admitting: *Deleted

## 2012-05-31 ENCOUNTER — Other Ambulatory Visit: Payer: Self-pay | Admitting: *Deleted

## 2012-05-31 ENCOUNTER — Telehealth: Payer: Self-pay | Admitting: *Deleted

## 2012-05-31 ENCOUNTER — Encounter: Payer: Self-pay | Admitting: Radiation Oncology

## 2012-05-31 DIAGNOSIS — C539 Malignant neoplasm of cervix uteri, unspecified: Secondary | ICD-10-CM

## 2012-05-31 MED ORDER — POTASSIUM CHLORIDE ER 10 MEQ PO TBCR: EXTENDED_RELEASE_TABLET | ORAL | Status: DC

## 2012-05-31 NOTE — Telephone Encounter (Signed)
Notified pt of Dr Darrold Span note below. Pt was not aware of lab appt on Friday at 12:00, states she will be here. States she needs refill on potassium. RN will take care of that.

## 2012-05-31 NOTE — Telephone Encounter (Signed)
Message copied by Phillis Knack on Wed May 31, 2012  3:18 PM ------      Message from: Lorine Bears      Created: Tue May 30, 2012  8:37 PM                   ----- Message -----         From: Reece Packer, MD         Sent: 05/26/2012   5:31 PM           To: Lorine Bears, RN            Labs seen and need follow up: please have her continue potassium at same dose (I believe 20 mEq bid), which has K+ in ok range now, and continue oral magnesium 400 mg daily, as Mg is still low, until we recheck labs on 06-02-12.

## 2012-05-31 NOTE — Progress Notes (Signed)
NPO AFTER MN. ARRIVES AT 0600. PRE-OP ORDERS PENDING, DR Roselind Messier NOTIFIED. IF NO LAB ORDERED FROM DR Roselind Messier , PT WILL NEED ISTAT.

## 2012-06-02 ENCOUNTER — Other Ambulatory Visit (HOSPITAL_BASED_OUTPATIENT_CLINIC_OR_DEPARTMENT_OTHER): Payer: No Typology Code available for payment source

## 2012-06-02 ENCOUNTER — Telehealth: Payer: Self-pay | Admitting: *Deleted

## 2012-06-02 DIAGNOSIS — C539 Malignant neoplasm of cervix uteri, unspecified: Secondary | ICD-10-CM

## 2012-06-02 DIAGNOSIS — E876 Hypokalemia: Secondary | ICD-10-CM

## 2012-06-02 LAB — BASIC METABOLIC PANEL (CC13)
BUN: 8.3 mg/dL (ref 7.0–26.0)
CO2: 25 mEq/L (ref 22–29)
Chloride: 102 mEq/L (ref 98–107)
Creatinine: 0.9 mg/dL (ref 0.6–1.1)
Glucose: 90 mg/dl (ref 70–99)
Potassium: 4.2 mEq/L (ref 3.5–5.1)

## 2012-06-02 LAB — CBC WITH DIFFERENTIAL/PLATELET
Basophils Absolute: 0 10*3/uL (ref 0.0–0.1)
Eosinophils Absolute: 0 10*3/uL (ref 0.0–0.5)
HCT: 29.1 % — ABNORMAL LOW (ref 34.8–46.6)
HGB: 9.9 g/dL — ABNORMAL LOW (ref 11.6–15.9)
MCH: 25.7 pg (ref 25.1–34.0)
MONO#: 0.4 10*3/uL (ref 0.1–0.9)
NEUT#: 2 10*3/uL (ref 1.5–6.5)
NEUT%: 57.6 % (ref 38.4–76.8)
RDW: 28.6 % — ABNORMAL HIGH (ref 11.2–14.5)
WBC: 3.4 10*3/uL — ABNORMAL LOW (ref 3.9–10.3)
lymph#: 1 10*3/uL (ref 0.9–3.3)

## 2012-06-02 MED ORDER — POTASSIUM CHLORIDE ER 10 MEQ PO TBCR: EXTENDED_RELEASE_TABLET | ORAL | Status: DC

## 2012-06-02 NOTE — Telephone Encounter (Signed)
Per Dr Cleophas Dunker, pt needs to decrease her K to daily and is to continue magnesium 400mg  until seen at f/u 2/24.  Spoke to pt, she verbalized understanding.

## 2012-06-05 ENCOUNTER — Encounter (HOSPITAL_BASED_OUTPATIENT_CLINIC_OR_DEPARTMENT_OTHER): Payer: Self-pay | Admitting: *Deleted

## 2012-06-05 ENCOUNTER — Ambulatory Visit (HOSPITAL_COMMUNITY)
Admission: RE | Admit: 2012-06-05 | Discharge: 2012-06-05 | Disposition: A | Discharge status: Home / Self Care | Payer: Medicaid Other | Source: Ambulatory Visit | Attending: Radiation Oncology | Admitting: Radiation Oncology

## 2012-06-05 ENCOUNTER — Ambulatory Visit
Admission: RE | Admit: 2012-06-05 | Discharge: 2012-06-05 | Disposition: A | Discharge status: Home / Self Care | Payer: Medicaid Other | Source: Ambulatory Visit | Attending: Radiation Oncology | Admitting: Radiation Oncology

## 2012-06-05 ENCOUNTER — Ambulatory Visit (HOSPITAL_BASED_OUTPATIENT_CLINIC_OR_DEPARTMENT_OTHER): Payer: No Typology Code available for payment source | Admitting: Anesthesiology

## 2012-06-05 ENCOUNTER — Encounter (HOSPITAL_BASED_OUTPATIENT_CLINIC_OR_DEPARTMENT_OTHER)
Admission: RE | Disposition: A | Discharge status: Home / Self Care | Payer: Self-pay | Source: Ambulatory Visit | Attending: Radiation Oncology

## 2012-06-05 ENCOUNTER — Encounter (HOSPITAL_BASED_OUTPATIENT_CLINIC_OR_DEPARTMENT_OTHER): Payer: Self-pay | Admitting: Anesthesiology

## 2012-06-05 ENCOUNTER — Ambulatory Visit (HOSPITAL_BASED_OUTPATIENT_CLINIC_OR_DEPARTMENT_OTHER)
Admission: RE | Admit: 2012-06-05 | Discharge: 2012-06-05 | Disposition: A | Discharge status: Home / Self Care | Payer: No Typology Code available for payment source | Source: Ambulatory Visit | Attending: Radiation Oncology | Admitting: Radiation Oncology

## 2012-06-05 VITALS — BP 109/70 | HR 61 | Temp 97.7°F | Resp 18

## 2012-06-05 DIAGNOSIS — C539 Malignant neoplasm of cervix uteri, unspecified: Secondary | ICD-10-CM

## 2012-06-05 DIAGNOSIS — Z79899 Other long term (current) drug therapy: Secondary | ICD-10-CM | POA: Insufficient documentation

## 2012-06-05 DIAGNOSIS — I10 Essential (primary) hypertension: Secondary | ICD-10-CM | POA: Insufficient documentation

## 2012-06-05 DIAGNOSIS — J45909 Unspecified asthma, uncomplicated: Secondary | ICD-10-CM | POA: Insufficient documentation

## 2012-06-05 HISTORY — PX: TANDEM RING INSERTION: SHX6199

## 2012-06-05 LAB — POCT I-STAT 4, (NA,K, GLUC, HGB,HCT)
HCT: 32 % — ABNORMAL LOW (ref 36.0–46.0)
Hemoglobin: 10.9 g/dL — ABNORMAL LOW (ref 12.0–15.0)
Sodium: 141 mEq/L (ref 135–145)

## 2012-06-05 SURGERY — INSERTION, UTERINE TANDEM AND RING OR CYLINDER, FOR BRACHYTHERAPY
Anesthesia: General | Site: Uterus | Wound class: Clean Contaminated

## 2012-06-05 MED ORDER — KETOROLAC TROMETHAMINE 30 MG/ML IJ SOLN
INTRAMUSCULAR | Status: DC | PRN
  Administered 2012-06-05: 30 mg via INTRAVENOUS

## 2012-06-05 MED ORDER — LACTATED RINGERS IV SOLN
INTRAVENOUS | Status: DC | PRN
  Administered 2012-06-05 (×2): via INTRAVENOUS

## 2012-06-05 MED ORDER — FENTANYL CITRATE 0.05 MG/ML IJ SOLN
INTRAMUSCULAR | Status: DC | PRN
  Administered 2012-06-05 (×4): 50 ug via INTRAVENOUS

## 2012-06-05 MED ORDER — LACTATED RINGERS IV BOLUS (SEPSIS)
1000.0000 mL | Freq: Once | INTRAVENOUS | Status: DC
  Filled 2012-06-05: qty 1000

## 2012-06-05 MED ORDER — LIDOCAINE HCL (CARDIAC) 20 MG/ML IV SOLN
INTRAVENOUS | Status: DC | PRN
  Administered 2012-06-05: 100 mg via INTRAVENOUS

## 2012-06-05 MED ORDER — LACTATED RINGERS IV SOLN
INTRAVENOUS | Status: DC
  Filled 2012-06-05: qty 1000

## 2012-06-05 MED ORDER — LACTATED RINGERS IV SOLN
INTRAVENOUS | Status: DC
  Administered 2012-06-05: 100 mL/h via INTRAVENOUS
  Filled 2012-06-05: qty 1000

## 2012-06-05 MED ORDER — ESTRADIOL 0.1 MG/GM VA CREA
TOPICAL_CREAM | VAGINAL | Status: DC | PRN
  Administered 2012-06-05: 1 via VAGINAL

## 2012-06-05 MED ORDER — ONDANSETRON HCL 4 MG/2ML IJ SOLN
INTRAMUSCULAR | Status: DC | PRN
  Administered 2012-06-05: 4 mg via INTRAVENOUS

## 2012-06-05 MED ORDER — PROPOFOL 10 MG/ML IV BOLUS
INTRAVENOUS | Status: DC | PRN
  Administered 2012-06-05: 200 mg via INTRAVENOUS

## 2012-06-05 MED ORDER — PROMETHAZINE HCL 25 MG/ML IJ SOLN
6.2500 mg | INTRAMUSCULAR | Status: DC | PRN
  Filled 2012-06-05: qty 1

## 2012-06-05 MED ORDER — WATER FOR IRRIGATION, STERILE IR SOLN
Status: DC | PRN
  Administered 2012-06-05: 250 mL via INTRAVESICAL

## 2012-06-05 MED ORDER — DEXAMETHASONE SODIUM PHOSPHATE 4 MG/ML IJ SOLN
INTRAMUSCULAR | Status: DC | PRN
  Administered 2012-06-05: 10 mg via INTRAVENOUS

## 2012-06-05 MED ORDER — MIDAZOLAM HCL 5 MG/5ML IJ SOLN
INTRAMUSCULAR | Status: DC | PRN
  Administered 2012-06-05: 2 mg via INTRAVENOUS

## 2012-06-05 MED ORDER — HYDROMORPHONE HCL PF 1 MG/ML IJ SOLN
0.2500 mg | INTRAMUSCULAR | Status: DC | PRN
  Filled 2012-06-05: qty 1

## 2012-06-05 SURGICAL SUPPLY — 32 items
BAG URINE DRAINAGE (UROLOGICAL SUPPLIES) ×2 IMPLANT
BANDAGE CONFORM 2  STR LF (GAUZE/BANDAGES/DRESSINGS) ×1 IMPLANT
CATH FOLEY 2WAY SLVR  5CC 16FR (CATHETERS) ×1
CATH FOLEY 2WAY SLVR 5CC 16FR (CATHETERS) ×1 IMPLANT
CLOTH BEACON ORANGE TIMEOUT ST (SAFETY) ×2 IMPLANT
COVER TABLE BACK 60X90 (DRAPES) ×2 IMPLANT
DRAPE LG THREE QUARTER DISP (DRAPES) ×2 IMPLANT
DRAPE UNDERBUTTOCKS STRL (DRAPE) ×2 IMPLANT
DRSG PAD ABDOMINAL 8X10 ST (GAUZE/BANDAGES/DRESSINGS) ×1 IMPLANT
GLOVE BIO SURGEON STRL SZ7.5 (GLOVE) ×2 IMPLANT
GOWN PREVENTION PLUS LG XLONG (DISPOSABLE) ×4 IMPLANT
HOLDER FOLEY CATH W/STRAP (MISCELLANEOUS) ×2 IMPLANT
LEGGING LITHOTOMY PAIR STRL (DRAPES) ×2 IMPLANT
NDL SPNL 22GX3.5 QUINCKE BK (NEEDLE) IMPLANT
NEEDLE SPNL 22GX3.5 QUINCKE BK (NEEDLE) IMPLANT
PACK BASIN DAY SURGERY FS (CUSTOM PROCEDURE TRAY) ×2 IMPLANT
PACKING VAGINAL (PACKING) ×1 IMPLANT
PAD OB MATERNITY 4.3X12.25 (PERSONAL CARE ITEMS) ×2 IMPLANT
PAD PREP 24X48 CUFFED NSTRL (MISCELLANEOUS) ×2 IMPLANT
PLUG CATH AND CAP STER (CATHETERS) ×2 IMPLANT
SET IRRIG Y TYPE TUR BLADDER L (SET/KITS/TRAYS/PACK) ×2 IMPLANT
SUT PDS AB 0 CT1 36 (SUTURE) IMPLANT
SUT PROLENE 0 SH 30 (SUTURE) IMPLANT
SUT SILK 2 0 30  PSL (SUTURE)
SUT SILK 2 0 30 PSL (SUTURE) IMPLANT
SYR BULB IRRIGATION 50ML (SYRINGE) IMPLANT
SYR CONTROL 10ML LL (SYRINGE) IMPLANT
SYRINGE 10CC LL (SYRINGE) ×2 IMPLANT
TOWEL OR 17X24 6PK STRL BLUE (TOWEL DISPOSABLE) ×4 IMPLANT
TRAY DSU PREP LF (CUSTOM PROCEDURE TRAY) ×2 IMPLANT
WATER STERILE IRR 3000ML UROMA (IV SOLUTION) ×3 IMPLANT
WATER STERILE IRR 500ML POUR (IV SOLUTION) ×2 IMPLANT

## 2012-06-05 NOTE — Anesthesia Preprocedure Evaluation (Addendum)
Anesthesia Evaluation  Patient identified by MRN, date of birth, ID band Patient awake    Reviewed: Allergy & Precautions, H&P , NPO status , Patient's Chart, lab work & pertinent test results  Airway Mallampati: II TM Distance: >3 FB Neck ROM: full    Dental no notable dental hx. (+) Teeth Intact and Dental Advisory Given   Pulmonary neg pulmonary ROS,  breath sounds clear to auscultation  Pulmonary exam normal       Cardiovascular Exercise Tolerance: Good hypertension, negative cardio ROS  Rhythm:regular Rate:Normal     Neuro/Psych negative neurological ROS  negative psych ROS   GI/Hepatic negative GI ROS, Neg liver ROS,   Endo/Other  negative endocrine ROSMorbid obesity  Renal/GU negative Renal ROS   Neoplasm Cervix negative genitourinary   Musculoskeletal   Abdominal   Peds  Hematology negative hematology ROS (+) Anemia   Anesthesia Other Findings   Reproductive/Obstetrics negative OB ROS Cervical Ca.                           Anesthesia Physical Anesthesia Plan  ASA: II  Anesthesia Plan: General   Post-op Pain Management:    Induction: Intravenous  Airway Management Planned: LMA  Additional Equipment:   Intra-op Plan:   Post-operative Plan: Extubation in OR  Informed Consent: I have reviewed the patients History and Physical, chart, labs and discussed the procedure including the risks, benefits and alternatives for the proposed anesthesia with the patient or authorized representative who has indicated his/her understanding and acceptance.   Dental advisory given  Plan Discussed with: CRNA  Anesthesia Plan Comments:         Anesthesia Quick Evaluation

## 2012-06-05 NOTE — Progress Notes (Signed)
06/05/2012  8:49 AM  PATIENT:  Peggy Pitts  32 y.o. female  PRE-OPERATIVE DIAGNOSIS:  CERVICAL CANCER  POST-OPERATIVE DIAGNOSIS:  CERVICAL CANCER  PROCEDURE:  Procedure(s): TANDEM RING INSERTION (N/A)  SURGEON:  Surgeon(s) and Role:    * Billie Lade, MD - Primary  PHYSICIAN ASSISTANT:   ASSISTANTS: none   ANESTHESIA:   general  EBL:  Total I/O In: 1000 [I.V.:1000] Out: -   BLOOD ADMINISTERED:none  DRAINS: Urinary Catheter (Foley)   LOCAL MEDICATIONS USED:  NONE  SPECIMEN:  No Specimen  DISPOSITION OF SPECIMEN:  N/A  COUNTS:  YES  TOURNIQUET:  * No tourniquets in log *  DICTATION: .Dragon Dictation This is a 32 year old female who was diagnosed with stage II-B cervical cancer. She initially underwent external beam radiation therapy along with radiosensitizing chemotherapy. Patient is now taken to the operating room for her third tandem and ring insertion in preparation for her first high-dose-rate treatment with iridium 192.  Patient was prepped and draped in the usual sterile fashion. Gen. anesthesia was used with LMA insertion. A Foley catheter was inserted. On exam under anesthesia the patient was noted to have a good response to her external beam and radiosensitizing chemotherapy. The cervix was estimated to be approximately 3.5 by 4 cm in size. There was no necrotic tumor noted at the cervix at this time. Erythema noted at the 3:00 position of the cervix. There was no obvious extension to the vaginal vault. The patient had approximately 250 cc of sterile water placed in the bladder for imaging purposes. The patient then underwent uterine sounding and dilation. The uterus was estimated to be approximately 8-1/2-9 cm in length. Excellent images were obtained on ultrasound. The uterus was anteverted. Under ultrasound guidance the patient had a 60 mm tandem placed within the uterus. This was the 30 tandem with the maximum curvature to fit the anteverted uterus.  Patient then had a 30 34 mm ring placed within the proximal vagina. A rectal paddle was then placed. On ultrasound imaging the tandem was good position within the center of the uterus on sagittal and transverse images. To aid in reducing the rectal dose sterile gauze soaked in Estrace cream was placed in the posterior vagina behind the ring paddle system The patient tolerated the procedure well. After recovery in anesthesia the patient will be transported to the Radiation Oncology Department for planning and treatment. She will be discharged later in the day after her radiation treatment is complete.      PLAN OF CARE: Transferred to radiation oncology once stable for planning and treatment  PATIENT DISPOSITION:  PACU - hemodynamically stable.   Delay start of Pharmacological VTE agent (>24hrs) due to surgical blood loss or risk of bleeding: not applicable

## 2012-06-05 NOTE — Anesthesia Procedure Notes (Signed)
Procedure Name: LMA Insertion Date/Time: 06/05/2012 7:43 AM Performed by: Jessica Priest Pre-anesthesia Checklist: Patient identified, Emergency Drugs available, Suction available and Patient being monitored Patient Re-evaluated:Patient Re-evaluated prior to inductionOxygen Delivery Method: Circle System Utilized Preoxygenation: Pre-oxygenation with 100% oxygen Intubation Type: IV induction Ventilation: Mask ventilation without difficulty LMA: LMA inserted LMA Size: 4.0 Number of attempts: 1 Airway Equipment and Method: bite block Placement Confirmation: positive ETCO2 Tube secured with: Tape Dental Injury: Teeth and Oropharynx as per pre-operative assessment

## 2012-06-05 NOTE — Anesthesia Postprocedure Evaluation (Signed)
Anesthesia Post Note  Patient: Peggy Pitts  Procedure(s) Performed: Procedure(s) (LRB): TANDEM RING INSERTION (N/A)  Anesthesia type: General  Patient location: PACU  Post pain: Pain level controlled  Post assessment: Post-op Vital signs reviewed  Last Vitals:  Filed Vitals:   06/05/12 0915  BP: 109/59  Pulse: 77  Temp:   Resp: 18    Post vital signs: Reviewed  Level of consciousness: sedated  Complications: No apparent anesthesia complications

## 2012-06-05 NOTE — Progress Notes (Signed)
Patient resting supine watching television. No distress noted. Patient denies pain. Emptied 1000 cc of straw yellow urine from collection bag. Discontinued completed bag of 1000 cc of LR. Started new 1000 cc bag of LR as ordered. IV site without redness, edema or pain. Encouraged patient to contact staff with needs.

## 2012-06-05 NOTE — Interval H&P Note (Signed)
History and Physical Interval Note:  06/05/2012 7:43 AM  Peggy Pitts  has presented today for surgery, with the diagnosis of CERVICAL CANCER  The various methods of treatment have been discussed with the patient and family. After consideration of risks, benefits and other options for treatment, the patient has consented to  Procedure(s): TANDEM RING INSERTION (N/A) as a surgical intervention .  The patient's history has been reviewed, patient examined, no change in status, stable for surgery.  I have reviewed the patient's chart and labs.  Questions were answered to the patient's satisfaction.     Antony Blackbird D

## 2012-06-05 NOTE — Progress Notes (Signed)
Simulation note  Patient was brought down from the outpatient surgical center. She was placed on the CT simulator table. The Foley catheter was accessed and contrast was placed in the bladder. She also had a rectal tube placed with contrast instilled into the rectal vault. A fiducial markers were placed within the ring tandem system. Patient proceeded to undergo CT scan in the treatment position. The tandem was in good position within the uterus.  Patient will undergo contouring of the ring/ tandem as well as bladder, rectum sigmoid colon and small bowel. She will proceed with her third high-dose-rate treatment with a dose of approximately 6 Gy to point A.      -----------------------------------   Billie Lade, PhD, MD

## 2012-06-05 NOTE — Progress Notes (Signed)
Provided patient with lunch tray. Patient resting supine in hospital bed with [facility] elevated 30 degrees. Patient alert and oriented to person, place, and time. No distress noted. Pleasant affect noted. Patient denies pain at this time. Tandem ovoid in place. Foley catheter to gravity drain with straw yellow urine. LR infusing in left hand without complications. Patient denies needs at this time. Encouraged to call with needs. Patient verbalized understanding.

## 2012-06-05 NOTE — Progress Notes (Signed)
    Department of Radiation Oncology  Phone:  [phone] Fax:        [phone]      High-dose-rate brachytherapy procedure note performed 06/05/2012   After planning was complete the patient was transferred to the high dose rate suite.   Simple treatment device note   While in the operating room the patient had construction of her custom ring tandem system.   Verification simulation note   An AP and lateral film was obtained in the treatment position. this was compared to planning films earlier in the day documenting good position of the tandem and ring system for treatment   High-dose-rate brachytherapy note   Patient proceeded to undergo her third high-dose-rate treatment. The remote afterloading device was attached to the ring tandem system. The patient was prescribed a dose of 6 Gy to be delivered to [facility] A . This was achieved with a total dwell time of 644 seconds. patient was treated with 2 channels using 10 dwell positions in the ring system and 6 dwell positions in the tandem. Patient tolerated the procedure well. After completion of her therapy a radiation survey was performed documenting return of the iridium source into the Nucletron safe.   -----------------------------------  Billie Lade, PhD, MD

## 2012-06-05 NOTE — Progress Notes (Signed)
Wheeled patient from surgery center to nurses station within radiation oncology. Patient alert and oriented to person, place, and time. No distress noted. Patient resting supine with HOB elevated 30 degree. Pleasant affect noted. Patient denies pain at this time. Lactated ringers kvo infusing into left hand 22 gauge iv. Foley catheter set to gravity drain with clear straw colored urine noted in collection bag. Apply SCDs. Provided patient with ginger ale to drink. Tandem ovoid in place. Instructed patient to call with needs.

## 2012-06-05 NOTE — H&P (View-Only) (Signed)
Radiation Oncology         [phone] ________________________________  Initial outpatient Consultation  Name: Peggy Pitts MRN: [id number]  Date: 05/16/2012  DOB: 11/02/1980   Pre-operative note  DIAGNOSIS: The encounter diagnosis was Cervical cancer.  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 female who is late last year was diagnosed with stage II-B cervical cancer. She proceeded to undergo therapy with external beam radiation treatments as well as radiosensitizing chemotherapy. She was originally scheduled for her first brachytherapy procedure approximately 1-1/2 weeks ago, however the patient developed severe diarrhea and was found to have C. difficile infection. Her first 2 brachy therapy procedures were therefore canceled.   the patient will complete her external beam radiation therapy tomorrow. She is scheduled for her first brachytherapy procedure on January 27.   PAST MEDICAL HISTORY:  has a past medical history of Cervical cancer; Diarrhea; Hypertension; Asthma; Anemia; Weakness generalized; and Nausea.    PAST SURGICAL HISTORY: Past Surgical History  Procedure Date  . Tubal ligation 2006  . Cervical biopsy 02/28/2012    poorly differentiated squamous cell ca    FAMILY HISTORY: family history includes Diabetes in her mother and Hypertension in her mother.  SOCIAL HISTORY:  reports that she has never smoked. She has never used smokeless tobacco. She reports that she drinks alcohol. She reports that she does not use illicit drugs.  ALLERGIES: Shellfish allergy and Adhesive  MEDICATIONS:  Current Outpatient Prescriptions  Medication Sig Dispense Refill  . BIOTIN PO Take 1 tablet by mouth 2 (two) times a week.      . ferrous gluconate (FERGON) 324 MG tablet Take daily on empty stomach with OJ  30 tablet  4  . LORazepam (ATIVAN) 1 MG tablet Take 0.5 to 1 mg under tongue or swallow every 6  hours as needed for nausea.  Will make drowsy.  30 tablet  0  . metroNIDAZOLE (FLAGYL) 500 MG tablet Take 1 tablet (500 mg total) by mouth 3 (three) times daily. Take at least 2 tablets today. Take exactly as scheduled  42 tablet  0  . metroNIDAZOLE (METROGEL) 0.75 % gel Apply topically at bedtime as needed. Apply to vagina  At bedtime insteat of douche.  45 g  1  . naproxen sodium (ANAPROX) 220 MG tablet Take 220 mg by mouth 2 (two) times daily as needed.      . ondansetron (ZOFRAN) 8 MG tablet Take 1-2 tablets (8-16 mg total) by mouth every 8 (eight) hours as needed for nausea (Non drowsy).  30 tablet  1  . potassium chloride (K-DUR) 10 MEQ tablet Take 1 by mouth daily  30 tablet  0   No current facility-administered medications for this encounter.   Facility-Administered Medications Ordered in Other Encounters  Medication Dose Route Frequency Provider Last Rate Last Dose  . 0.9 %  sodium chloride infusion  250 mL Intravenous Once Lennis P Livesay, MD      . acetaminophen (TYLENOL) tablet 650 mg  650 mg Oral Once Lennis P Livesay, MD        REVIEW OF SYSTEMS:  A 15 point review of systems is documented in the electronic medical record. This was obtained by the nursing staff. However, I reviewed this with the patient to discuss relevant findings and make appropriate changes.  She denies any more diarrhea. She will complete her Flagyl prescription this afternoon. She denies any vaginal bleeding. She has minimal   vaginal discharge at this time. She denies any urination difficulties.   PHYSICAL EXAM:  weight is 217 lb 6.4 oz (98.612 kg). Her blood pressure is 137/82 and her pulse is 115. Her respiration is 18.   BP 137/82  Pulse 115  Resp 18  Wt 217 lb 6.4 oz (98.612 kg)  General Appearance:    Alert, cooperative, no distress, appears stated age  Head:    Normocephalic, without obvious abnormality, atraumatic  Eyes:    PERRL, conjunctiva/corneas clear, EOM's intact     Nose:   Nares normal,  septum midline, mucosa normal, no drainage    or sinus tenderness  Throat:   Lips, mucosa, and tongue normal; teeth and gums normal  Neck:   Supple, symmetrical, trachea midline, no adenopathy;    thyroid:  no enlargement/tenderness/nodules; no carotid   bruit or JVD  Back:     Symmetric, no curvature, ROM normal, no CVA tenderness  Lungs:     Clear to auscultation bilaterally, respirations unlabored  Chest Wall:    No tenderness or deformity   Heart:    Regular rate and rhythm, no murmur, rub   or gallop     Abdomen:     Soft, non-tender, bowel sounds active all four quadrants,    no masses, no organomegaly  Genitalia:   will be performed in the operating room   Rectal:    will be performed in the operating room    Extremities:   Extremities normal, atraumatic, no cyanosis or edema  Pulses:   2+ and symmetric all extremities  Skin:   Skin color, texture, turgor normal, no rashes or lesions  Lymph nodes:   Cervical, supraclavicular, and axillary nodes normal  Neurologic:   CNII-XII intact, normal strength, sensation and reflexes    throughout    LABORATORY DATA:  Lab Results  Component Value Date   WBC 2.8* 05/08/2012   HGB 11.2* 05/08/2012   HCT 33.6* 05/08/2012   MCV 70.7* 05/08/2012   PLT 122* 05/08/2012   Lab Results  Component Value Date   NA 135* 05/08/2012   K 3.3* 05/08/2012   CL 99 05/08/2012   CO2 25 05/08/2012   Lab Results  Component Value Date   ALT 21 05/05/2012   AST 16 05/05/2012   ALKPHOS 79 05/05/2012   BILITOT 0.30 05/05/2012         IMPRESSION: 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 She will receive 4 subsequent treatments on a weekly basis.     ------------------------------------------------  -----------------------------------  Jartavious Mckimmy D. Anysa Tacey, PhD, MD         

## 2012-06-05 NOTE — Transfer of Care (Signed)
Immediate Anesthesia Transfer of Care Note  Patient: Peggy Pitts  Procedure(s) Performed: Procedure(s) (LRB): TANDEM RING INSERTION (N/A)  Patient Location: PACU  Anesthesia Type: General  Level of Consciousness: awake, sedated, patient cooperative and responds to stimulation  Airway & Oxygen Therapy: Patient Spontanous Breathing and Patient connected to face mask oxygen  Post-op Assessment: Report given to PACU RN, Post -op Vital signs reviewed and stable and Patient moving all extremities  Post vital signs: Reviewed and stable  Complications: No apparent anesthesia complications

## 2012-06-05 NOTE — Progress Notes (Signed)
Received patient in the clinic from HDR suite. Patient resting on stretcher supine with HOB slightly elevated. Patient alert and oriented to person, place, and time. No distress noted. Pleasant affect noted. Patient denies pain at this time. Discontinued complete bag of 1000 cc Lactated ringers. Removed left hand IV. Catheter intact upon removal. Applied an occlusive dressing to IV site. Emptied 800 cc of straw yellow clear urine from foley collection bag. Removed foley catheter. Patient tolerated well. Encouraged patient to increased fluid intake and contact staff if she has not void within four hours. Patient verbalized understanding. Assisted Dr. Roselind Messier with removal of tandem ovoid. Patient tolerated well. Assisted patient to bedside. Assisted patient as she put on street clothes. Wheeled patient to lobby for discharge.

## 2012-06-06 ENCOUNTER — Encounter (HOSPITAL_BASED_OUTPATIENT_CLINIC_OR_DEPARTMENT_OTHER): Payer: Self-pay | Admitting: Radiation Oncology

## 2012-06-07 ENCOUNTER — Encounter: Payer: Self-pay | Admitting: Radiation Oncology

## 2012-06-07 ENCOUNTER — Encounter (HOSPITAL_BASED_OUTPATIENT_CLINIC_OR_DEPARTMENT_OTHER): Payer: Self-pay | Admitting: *Deleted

## 2012-06-07 NOTE — Progress Notes (Signed)
NPO AFTER MN. ARRIVES AT 0615. PRE-OP ORDERS PENDING, NOTIFIED DR Roselind Messier OFFICE, SPOKE W/  SHIRLEY.

## 2012-06-12 ENCOUNTER — Encounter (HOSPITAL_BASED_OUTPATIENT_CLINIC_OR_DEPARTMENT_OTHER)
Admission: RE | Disposition: A | Discharge status: Home / Self Care | Payer: Self-pay | Source: Ambulatory Visit | Attending: Radiation Oncology

## 2012-06-12 ENCOUNTER — Ambulatory Visit (HOSPITAL_BASED_OUTPATIENT_CLINIC_OR_DEPARTMENT_OTHER): Payer: Medicaid Other | Admitting: Anesthesiology

## 2012-06-12 ENCOUNTER — Ambulatory Visit
Admission: RE | Admit: 2012-06-12 | Discharge: 2012-06-12 | Disposition: A | Discharge status: Home / Self Care | Payer: Medicaid Other | Source: Ambulatory Visit | Attending: Radiation Oncology | Admitting: Radiation Oncology

## 2012-06-12 ENCOUNTER — Encounter (HOSPITAL_BASED_OUTPATIENT_CLINIC_OR_DEPARTMENT_OTHER): Payer: Self-pay | Admitting: *Deleted

## 2012-06-12 ENCOUNTER — Ambulatory Visit (HOSPITAL_BASED_OUTPATIENT_CLINIC_OR_DEPARTMENT_OTHER)
Admission: RE | Admit: 2012-06-12 | Discharge: 2012-06-12 | Disposition: A | Discharge status: Home / Self Care | Payer: Medicaid Other | Source: Ambulatory Visit | Attending: Radiation Oncology | Admitting: Radiation Oncology

## 2012-06-12 ENCOUNTER — Ambulatory Visit (HOSPITAL_COMMUNITY)
Admission: RE | Admit: 2012-06-12 | Discharge: 2012-06-12 | Disposition: A | Discharge status: Home / Self Care | Payer: No Typology Code available for payment source | Source: Ambulatory Visit | Attending: Radiation Oncology | Admitting: Radiation Oncology

## 2012-06-12 ENCOUNTER — Encounter (HOSPITAL_BASED_OUTPATIENT_CLINIC_OR_DEPARTMENT_OTHER): Payer: Self-pay | Admitting: Anesthesiology

## 2012-06-12 ENCOUNTER — Ambulatory Visit (HOSPITAL_COMMUNITY)
Admission: RE | Admit: 2012-06-12 | Discharge: 2012-06-12 | Disposition: A | Discharge status: Home / Self Care | Payer: Medicaid Other | Source: Ambulatory Visit | Attending: Radiation Oncology | Admitting: Radiation Oncology

## 2012-06-12 DIAGNOSIS — I10 Essential (primary) hypertension: Secondary | ICD-10-CM | POA: Insufficient documentation

## 2012-06-12 DIAGNOSIS — C539 Malignant neoplasm of cervix uteri, unspecified: Secondary | ICD-10-CM | POA: Insufficient documentation

## 2012-06-12 DIAGNOSIS — Z79899 Other long term (current) drug therapy: Secondary | ICD-10-CM | POA: Insufficient documentation

## 2012-06-12 HISTORY — PX: TANDEM RING INSERTION: SHX6199

## 2012-06-12 LAB — POCT I-STAT 4, (NA,K, GLUC, HGB,HCT)
HCT: 36 % (ref 36.0–46.0)
Hemoglobin: 12.2 g/dL (ref 12.0–15.0)
Potassium: 3.8 mEq/L (ref 3.5–5.1)
Sodium: 141 mEq/L (ref 135–145)

## 2012-06-12 SURGERY — INSERTION, UTERINE TANDEM AND RING OR CYLINDER, FOR BRACHYTHERAPY
Anesthesia: General | Site: Cervix | Wound class: Clean Contaminated

## 2012-06-12 MED ORDER — ESTRADIOL 0.1 MG/GM VA CREA
TOPICAL_CREAM | VAGINAL | Status: DC | PRN
  Administered 2012-06-12: 1 via VAGINAL

## 2012-06-12 MED ORDER — FENTANYL CITRATE 0.05 MG/ML IJ SOLN
INTRAMUSCULAR | Status: DC | PRN
  Administered 2012-06-12: 50 ug via INTRAVENOUS
  Administered 2012-06-12: 25 ug via INTRAVENOUS
  Administered 2012-06-12: 50 ug via INTRAVENOUS
  Administered 2012-06-12 (×3): 25 ug via INTRAVENOUS

## 2012-06-12 MED ORDER — ACETAMINOPHEN 10 MG/ML IV SOLN
1000.0000 mg | Freq: Once | INTRAVENOUS | Status: AC | PRN
  Filled 2012-06-12: qty 100

## 2012-06-12 MED ORDER — MEPERIDINE HCL 25 MG/ML IJ SOLN
6.2500 mg | INTRAMUSCULAR | Status: DC | PRN
  Filled 2012-06-12: qty 1

## 2012-06-12 MED ORDER — LACTATED RINGERS IV SOLN
INTRAVENOUS | Status: DC
  Administered 2012-06-12: 07:00:00 via INTRAVENOUS
  Filled 2012-06-12 (×2): qty 1000

## 2012-06-12 MED ORDER — MIDAZOLAM HCL 5 MG/5ML IJ SOLN
INTRAMUSCULAR | Status: DC | PRN
  Administered 2012-06-12: 2 mg via INTRAVENOUS

## 2012-06-12 MED ORDER — PROPOFOL 10 MG/ML IV BOLUS
INTRAVENOUS | Status: DC | PRN
  Administered 2012-06-12: 200 mg via INTRAVENOUS

## 2012-06-12 MED ORDER — OXYCODONE HCL 5 MG PO TABS
5.0000 mg | ORAL_TABLET | Freq: Once | ORAL | Status: AC | PRN
  Filled 2012-06-12: qty 1

## 2012-06-12 MED ORDER — WATER FOR IRRIGATION, STERILE IR SOLN
Status: DC | PRN
  Administered 2012-06-12: 3000 mL

## 2012-06-12 MED ORDER — OXYCODONE HCL 5 MG/5ML PO SOLN
5.0000 mg | Freq: Once | ORAL | Status: AC | PRN
  Filled 2012-06-12: qty 5

## 2012-06-12 MED ORDER — HYDROMORPHONE HCL PF 1 MG/ML IJ SOLN
0.2500 mg | INTRAMUSCULAR | Status: DC | PRN
  Filled 2012-06-12: qty 1

## 2012-06-12 MED ORDER — ONDANSETRON HCL 4 MG/2ML IJ SOLN
INTRAMUSCULAR | Status: DC | PRN
  Administered 2012-06-12: 4 mg via INTRAVENOUS

## 2012-06-12 MED ORDER — KETOROLAC TROMETHAMINE 30 MG/ML IJ SOLN
INTRAMUSCULAR | Status: DC | PRN
  Administered 2012-06-12: 30 mg via INTRAVENOUS

## 2012-06-12 MED ORDER — DEXAMETHASONE SODIUM PHOSPHATE 4 MG/ML IJ SOLN
INTRAMUSCULAR | Status: DC | PRN
  Administered 2012-06-12: 10 mg via INTRAVENOUS

## 2012-06-12 MED ORDER — LIDOCAINE HCL (CARDIAC) 20 MG/ML IV SOLN
INTRAVENOUS | Status: DC | PRN
  Administered 2012-06-12: 100 mg via INTRAVENOUS

## 2012-06-12 MED ORDER — LACTATED RINGERS IV SOLN
INTRAVENOUS | Status: DC | PRN
  Administered 2012-06-12 (×2): via INTRAVENOUS

## 2012-06-12 MED ORDER — PROMETHAZINE HCL 25 MG/ML IJ SOLN
6.2500 mg | INTRAMUSCULAR | Status: DC | PRN
  Filled 2012-06-12: qty 1

## 2012-06-12 SURGICAL SUPPLY — 36 items
BAG URINE DRAINAGE (UROLOGICAL SUPPLIES) ×2 IMPLANT
BANDAGE CONFORM 2  STR LF (GAUZE/BANDAGES/DRESSINGS) IMPLANT
CATH FOLEY 2WAY SLVR  5CC 16FR (CATHETERS) ×1
CATH FOLEY 2WAY SLVR 5CC 16FR (CATHETERS) ×1 IMPLANT
CLOTH BEACON ORANGE TIMEOUT ST (SAFETY) ×2 IMPLANT
COVER TABLE BACK 60X90 (DRAPES) ×1 IMPLANT
DRAPE LG THREE QUARTER DISP (DRAPES) ×1 IMPLANT
DRAPE UNDERBUTTOCKS STRL (DRAPE) ×1 IMPLANT
DRSG PAD ABDOMINAL 8X10 ST (GAUZE/BANDAGES/DRESSINGS) IMPLANT
GAUZE SPONGE 4X4 16PLY XRAY LF (GAUZE/BANDAGES/DRESSINGS) ×1 IMPLANT
GLOVE BIO SURGEON STRL SZ7.5 (GLOVE) ×3 IMPLANT
GLOVE BIOGEL PI IND STRL 7.0 (GLOVE) IMPLANT
GLOVE BIOGEL PI INDICATOR 7.0 (GLOVE) ×1
GLOVE INDICATOR 6.5 STRL GRN (GLOVE) ×1 IMPLANT
GOWN PREVENTION PLUS LG XLONG (DISPOSABLE) ×4 IMPLANT
HOLDER FOLEY CATH W/STRAP (MISCELLANEOUS) ×2 IMPLANT
LEGGING LITHOTOMY PAIR STRL (DRAPES) ×2 IMPLANT
NDL SPNL 22GX3.5 QUINCKE BK (NEEDLE) IMPLANT
NEEDLE SPNL 22GX3.5 QUINCKE BK (NEEDLE) IMPLANT
PACK BASIN DAY SURGERY FS (CUSTOM PROCEDURE TRAY) ×1 IMPLANT
PACK CYSTOSCOPY (CUSTOM PROCEDURE TRAY) ×1 IMPLANT
PAD OB MATERNITY 4.3X12.25 (PERSONAL CARE ITEMS) ×2 IMPLANT
PAD PREP 24X48 CUFFED NSTRL (MISCELLANEOUS) ×2 IMPLANT
PLUG CATH AND CAP STER (CATHETERS) ×2 IMPLANT
SET IRRIG Y TYPE TUR BLADDER L (SET/KITS/TRAYS/PACK) ×1 IMPLANT
SUT PDS AB 0 CT1 36 (SUTURE) IMPLANT
SUT PROLENE 0 SH 30 (SUTURE) IMPLANT
SUT SILK 2 0 30  PSL (SUTURE)
SUT SILK 2 0 30 PSL (SUTURE) IMPLANT
SYR BULB IRRIGATION 50ML (SYRINGE) IMPLANT
SYR CONTROL 10ML LL (SYRINGE) IMPLANT
SYRINGE 10CC LL (SYRINGE) ×2 IMPLANT
TOWEL OR 17X24 6PK STRL BLUE (TOWEL DISPOSABLE) ×4 IMPLANT
TRAY DSU PREP LF (CUSTOM PROCEDURE TRAY) ×2 IMPLANT
WATER STERILE IRR 3000ML UROMA (IV SOLUTION) ×2 IMPLANT
WATER STERILE IRR 500ML POUR (IV SOLUTION) ×2 IMPLANT

## 2012-06-12 NOTE — H&P (View-Only) (Signed)
Radiation Oncology         [phone] ________________________________  Initial outpatient Consultation  Name: Peggy Pitts MRN: [id number]  Date: 05/16/2012  DOB: 07/30/1980   Pre-operative note  DIAGNOSIS: The encounter diagnosis was Cervical cancer.  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 female who is late last year was diagnosed with stage II-B cervical cancer. She proceeded to undergo therapy with external beam radiation treatments as well as radiosensitizing chemotherapy. She was originally scheduled for her first brachytherapy procedure approximately 1-1/2 weeks ago, however the patient developed severe diarrhea and was found to have C. difficile infection. Her first 2 brachy therapy procedures were therefore canceled.   the patient will complete her external beam radiation therapy tomorrow. She is scheduled for her first brachytherapy procedure on January 27.   PAST MEDICAL HISTORY:  has a past medical history of Cervical cancer; Diarrhea; Hypertension; Asthma; Anemia; Weakness generalized; and Nausea.    PAST SURGICAL HISTORY: Past Surgical History  Procedure Date  . Tubal ligation 2006  . Cervical biopsy 02/28/2012    poorly differentiated squamous cell ca    FAMILY HISTORY: family history includes Diabetes in her mother and Hypertension in her mother.  SOCIAL HISTORY:  reports that she has never smoked. She has never used smokeless tobacco. She reports that she drinks alcohol. She reports that she does not use illicit drugs.  ALLERGIES: Shellfish allergy and Adhesive  MEDICATIONS:  Current Outpatient Prescriptions  Medication Sig Dispense Refill  . BIOTIN PO Take 1 tablet by mouth 2 (two) times a week.      . ferrous gluconate (FERGON) 324 MG tablet Take daily on empty stomach with OJ  30 tablet  4  . LORazepam (ATIVAN) 1 MG tablet Take 0.5 to 1 mg under tongue or swallow every 6  hours as needed for nausea.  Will make drowsy.  30 tablet  0  . metroNIDAZOLE (FLAGYL) 500 MG tablet Take 1 tablet (500 mg total) by mouth 3 (three) times daily. Take at least 2 tablets today. Take exactly as scheduled  42 tablet  0  . metroNIDAZOLE (METROGEL) 0.75 % gel Apply topically at bedtime as needed. Apply to vagina  At bedtime insteat of douche.  45 g  1  . naproxen sodium (ANAPROX) 220 MG tablet Take 220 mg by mouth 2 (two) times daily as needed.      . ondansetron (ZOFRAN) 8 MG tablet Take 1-2 tablets (8-16 mg total) by mouth every 8 (eight) hours as needed for nausea (Non drowsy).  30 tablet  1  . potassium chloride (K-DUR) 10 MEQ tablet Take 1 by mouth daily  30 tablet  0   No current facility-administered medications for this encounter.   Facility-Administered Medications Ordered in Other Encounters  Medication Dose Route Frequency Provider Last Rate Last Dose  . 0.9 %  sodium chloride infusion  250 mL Intravenous Once Lennis P Livesay, MD      . acetaminophen (TYLENOL) tablet 650 mg  650 mg Oral Once Lennis P Livesay, MD        REVIEW OF SYSTEMS:  A 15 point review of systems is documented in the electronic medical record. This was obtained by the nursing staff. However, I reviewed this with the patient to discuss relevant findings and make appropriate changes.  She denies any more diarrhea. She will complete her Flagyl prescription this afternoon. She denies any vaginal bleeding. She has minimal   vaginal discharge at this time. She denies any urination difficulties.   PHYSICAL EXAM:  weight is 217 lb 6.4 oz (98.612 kg). Her blood pressure is 137/82 and her pulse is 115. Her respiration is 18.   BP 137/82  Pulse 115  Resp 18  Wt 217 lb 6.4 oz (98.612 kg)  General Appearance:    Alert, cooperative, no distress, appears stated age  Head:    Normocephalic, without obvious abnormality, atraumatic  Eyes:    PERRL, conjunctiva/corneas clear, EOM's intact     Nose:   Nares normal,  septum midline, mucosa normal, no drainage    or sinus tenderness  Throat:   Lips, mucosa, and tongue normal; teeth and gums normal  Neck:   Supple, symmetrical, trachea midline, no adenopathy;    thyroid:  no enlargement/tenderness/nodules; no carotid   bruit or JVD  Back:     Symmetric, no curvature, ROM normal, no CVA tenderness  Lungs:     Clear to auscultation bilaterally, respirations unlabored  Chest Wall:    No tenderness or deformity   Heart:    Regular rate and rhythm, no murmur, rub   or gallop     Abdomen:     Soft, non-tender, bowel sounds active all four quadrants,    no masses, no organomegaly  Genitalia:   will be performed in the operating room   Rectal:    will be performed in the operating room    Extremities:   Extremities normal, atraumatic, no cyanosis or edema  Pulses:   2+ and symmetric all extremities  Skin:   Skin color, texture, turgor normal, no rashes or lesions  Lymph nodes:   Cervical, supraclavicular, and axillary nodes normal  Neurologic:   CNII-XII intact, normal strength, sensation and reflexes    throughout    LABORATORY DATA:  Lab Results  Component Value Date   WBC 2.8* 05/08/2012   HGB 11.2* 05/08/2012   HCT 33.6* 05/08/2012   MCV 70.7* 05/08/2012   PLT 122* 05/08/2012   Lab Results  Component Value Date   NA 135* 05/08/2012   K 3.3* 05/08/2012   CL 99 05/08/2012   CO2 25 05/08/2012   Lab Results  Component Value Date   ALT 21 05/05/2012   AST 16 05/05/2012   ALKPHOS 79 05/05/2012   BILITOT 0.30 05/05/2012         IMPRESSION: 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 She will receive 4 subsequent treatments on a weekly basis.     ------------------------------------------------  -----------------------------------  Zelta Enfield D. Essense Bousquet, PhD, MD         

## 2012-06-12 NOTE — Progress Notes (Signed)
Wheeled patient from surgical center to exam room 1 in nursing for monitoring prior to ct/simulation. Patient resting supine in hospital bed with HOB only elevated 20 degrees. Ring and tandem in place. Foley catheter to gravity drain with clear yellow urine noted in collection bag. Patient alert and oriented to person, place, and time. No distress noted. Pleasant affect noted. Patient denies pain. Applied SCDs. Put call bell within reach. Encouraged patient to call with needs. LR infusing via gravity drain into left hand IV. Site without redness, edema or pain. Patient wheeled to CT/SIM around 1000. Patient received back in nursing at 1045. Resumed SCDs and IV fluids. Patient continues to deny pain. Provided patient with regular lunch tray at 1135. Patient tolerated well. Discontinued initial 1000 cc bag of LR. Started second bag of 1000 cc LR. IV site continues to be without redness, pain or edema. Emptied 1400 cc of clear yellow urine from collection bag at 1300. 1620 per Dr. Trina Ao order removed left hand IV and foley catheter. IV catheter intact upon removal. Patient tolerated removal of both well. Encourage patient to force fluids and contact staff if she hasn't voided within four hours. Assisted Dr. Roselind Messier with removal of ring and tandem. Patient tolerated well. Patient cleaned up and changed into street clothes. Wheeled patient to lobby for discharge home with brother.

## 2012-06-12 NOTE — Progress Notes (Signed)
06/12/2012  8:44 AM  PATIENT:  Peggy Pitts  32 y.o. female  PRE-OPERATIVE DIAGNOSIS:  CERVICAL CANCER  POST-OPERATIVE DIAGNOSIS:  CERVICAL CANCER  PROCEDURE:  Procedure(s): TANDEM RING INSERTION (N/A)  SURGEON:  Surgeon(s) and Role:    * Billie Lade, MD - Primary  PHYSICIAN ASSISTANT:   ASSISTANTS: none   ANESTHESIA:   general  EBL:  Total I/O In: 1000 [I.V.:1000] Out: -   BLOOD ADMINISTERED:none  DRAINS: Urinary Catheter (Foley)   LOCAL MEDICATIONS USED:  NONE  SPECIMEN:  No Specimen  DISPOSITION OF SPECIMEN:  N/A  COUNTS:  YES  TOURNIQUET:  * No tourniquets in log *  DICTATION: .Dragon Dictation This is a 32 year old female who was diagnosed with stage II-B cervical cancer. She initially underwent external beam radiation therapy along with radiosensitizing chemotherapy. Patient is now taken to the operating room for her fourth tandem and ring insertion in preparation for her first high-dose-rate treatment with iridium 192.  Patient was prepped and draped in the usual sterile fashion. Gen. anesthesia was used with LMA insertion. A Foley catheter was inserted. On exam under anesthesia the patient was noted to have a good response to her external beam and radiosensitizing chemotherapy. The cervix was estimated to be approximately 4 by 4 cm in size. There was no necrotic tumor noted at the cervix at this time. Erythema noted at the 9:00 o'clock position of the cervix. There was no obvious extension to the vaginal vault. The patient had approximately 250 cc of sterile water placed in the bladder for imaging purposes. The patient then underwent uterine sounding and dilation. The uterus was estimated to be approximately 8-1/2-9 cm in length. Excellent images were obtained on ultrasound. The uterus was anteverted. Under ultrasound guidance the patient had a 60 mm tandem placed within the uterus. This was the 30 tandem with the maximum curvature to fit the anteverted  uterus. Patient then had a 30 34 mm ring placed within the proximal vagina. A rectal paddle was then placed. On ultrasound imaging the tandem was good position within the center of the uterus on sagittal and transverse images. To aid in reducing the rectal dose sterile gauze soaked in Estrace cream was placed in the posterior vagina behind the rectal  paddle system The patient tolerated the procedure well. After recovery in anesthesia the patient will be transported to the Radiation Oncology Department for planning and treatment. She will be discharged later in the day after her radiation treatment is complete.       PLAN OF CARE: transfer to radiation oncology for planning and treatment  PATIENT DISPOSITION:  PACU - hemodynamically stable.   Delay start of Pharmacological VTE agent (>24hrs) due to surgical blood loss or risk of bleeding: not applicable

## 2012-06-12 NOTE — Progress Notes (Signed)
High-dose-rate brachytherapy procedure note performed  After planning was complete the patient was transferred to the high dose rate suite.  Simple treatment device note  While in the operating room the patient had construction of her custom ring tandem system.  Verification simulation note  An AP and lateral film was obtained in the treatment position. this was compared to planning films earlier in the day documenting good position of the tandem and ring system for treatment  High-dose-rate brachytherapy note  Patient proceeded to undergo her fourth high-dose-rate treatment. The remote afterloading device was attached to the ring tandem system. The patient was prescribed a dose of 6 Gy to be delivered to [facility] A . This was achieved with a total dwell time of 702.7 seconds. patient was treated with 2 channels using 18 dwell positions in the ring system and 6 dwell positions in the tandem. Patient tolerated the procedure well. After completion of her therapy a radiation survey was performed documenting return of the iridium source into the Nucletron safe.  -----------------------------------  Billie Lade, PhD, MD

## 2012-06-12 NOTE — Progress Notes (Signed)
    Simulation note  Patient was brought down from the outpatient surgical center. She was placed on the CT simulator table.  A fiducial markers were placed within the ring tandem system. Patient proceeded to undergo CT scan in the treatment position. The tandem was in good position within the uterus. Patient will undergo contouring of the ring/ tandem as well as bladder, rectum sigmoid colon and small bowel. She will proceed with her fourth high-dose-rate treatment with a dose of approximately 6 Gy to point A.       -----------------------------------   Billie Lade, PhD, MD

## 2012-06-12 NOTE — Anesthesia Postprocedure Evaluation (Signed)
Anesthesia Post Note  Patient: Peggy Pitts  Procedure(s) Performed: Procedure(s) (LRB): TANDEM RING INSERTION (N/A)  Anesthesia type: General  Patient location: PACU  Post pain: Pain level controlled  Post assessment: Post-op Vital signs reviewed  Last Vitals: BP 106/52  Pulse 86  Temp(Src) 36.6 C (Oral)  Resp 14  Ht 5\' 6"  (1.676 m)  Wt 214 lb (97.07 kg)  BMI 34.56 kg/m2  SpO2 100%  LMP 03/07/2012  Post vital signs: Reviewed  Level of consciousness: sedated  Complications: No apparent anesthesia complications

## 2012-06-12 NOTE — Progress Notes (Signed)
Transfer to radiology oncology via stretcher accompanied by oncology nursing staff.

## 2012-06-12 NOTE — Interval H&P Note (Signed)
History and Physical Interval Note:  06/12/2012 7:43 AM  Peggy Pitts  has presented today for surgery, with the diagnosis of CERVICAL CANCER  The various methods of treatment have been discussed with the patient and family. After consideration of risks, benefits and other options for treatment, the patient has consented to  Procedure(s): TANDEM RING INSERTION (N/A) as a surgical intervention .  The patient's history has been reviewed, patient examined, no change in status, stable for surgery.  I have reviewed the patient's chart and labs.  Questions were answered to the patient's satisfaction.     Antony Blackbird D

## 2012-06-12 NOTE — Anesthesia Procedure Notes (Signed)
Procedure Name: LMA Insertion Date/Time: 06/12/2012 7:48 AM Performed by: Jessica Priest Pre-anesthesia Checklist: Patient identified, Emergency Drugs available, Suction available and Patient being monitored Patient Re-evaluated:Patient Re-evaluated prior to inductionOxygen Delivery Method: Circle System Utilized Preoxygenation: Pre-oxygenation with 100% oxygen Intubation Type: IV induction Ventilation: Mask ventilation without difficulty LMA: LMA inserted LMA Size: 4.0 Number of attempts: 1 Airway Equipment and Method: bite block Placement Confirmation: positive ETCO2 Tube secured with: Tape Dental Injury: Teeth and Oropharynx as per pre-operative assessment

## 2012-06-12 NOTE — Anesthesia Preprocedure Evaluation (Addendum)
Anesthesia Evaluation  Patient identified by MRN, date of birth, ID band Patient awake    Reviewed: Allergy & Precautions, H&P , NPO status , Patient's Chart, lab work & pertinent test results  Airway Mallampati: II TM Distance: >3 FB Neck ROM: Full    Dental no notable dental hx. (+) Teeth Intact and Dental Advisory Given   Pulmonary neg pulmonary ROS,  breath sounds clear to auscultation  Pulmonary exam normal       Cardiovascular Exercise Tolerance: Good hypertension, negative cardio ROS  Rhythm:regular Rate:Normal     Neuro/Psych negative neurological ROS  negative psych ROS   GI/Hepatic negative GI ROS, Neg liver ROS,   Endo/Other  negative endocrine ROSMorbid obesity  Renal/GU negative Renal ROS   Neoplasm Cervix negative genitourinary   Musculoskeletal   Abdominal   Peds  Hematology negative hematology ROS (+) Anemia   Anesthesia Other Findings   Reproductive/Obstetrics negative OB ROS Cervical Ca.                          Anesthesia Physical  Anesthesia Plan  ASA: II  Anesthesia Plan: General   Post-op Pain Management:    Induction: Intravenous  Airway Management Planned: LMA  Additional Equipment:   Intra-op Plan:   Post-operative Plan: Extubation in OR  Informed Consent: I have reviewed the patients History and Physical, chart, labs and discussed the procedure including the risks, benefits and alternatives for the proposed anesthesia with the patient or authorized representative who has indicated his/her understanding and acceptance.   Dental advisory given  Plan Discussed with: CRNA  Anesthesia Plan Comments:         Anesthesia Quick Evaluation

## 2012-06-12 NOTE — Transfer of Care (Signed)
Immediate Anesthesia Transfer of Care Note  Patient: Peggy Pitts  Procedure(s) Performed: Procedure(s) (LRB): TANDEM RING INSERTION (N/A)  Patient Location: PACU  Anesthesia Type: General  Level of Consciousness: awake, sedated, patient cooperative and responds to stimulation  Airway & Oxygen Therapy: Patient Spontanous Breathing and Patient connected to nasal cannula O2.  Post-op Assessment: Report given to PACU RN, Post -op Vital signs reviewed and stable and Patient moving all extremities  Post vital signs: Reviewed and stable  Complications: No apparent anesthesia complications

## 2012-06-13 ENCOUNTER — Encounter (HOSPITAL_BASED_OUTPATIENT_CLINIC_OR_DEPARTMENT_OTHER): Payer: Self-pay | Admitting: Radiation Oncology

## 2012-06-13 NOTE — Progress Notes (Signed)
Pt instructed npo p mn 2/23.  To [facility] 2/24 @ 0615.  Needs hgb on arrival.  Orders pending. Pt states no change from last surgery.

## 2012-06-14 ENCOUNTER — Telehealth: Payer: Self-pay | Admitting: Radiation Oncology

## 2012-06-14 NOTE — Telephone Encounter (Signed)
Patient phoned today to inquire if it was safe to remove vaginal packing. Per Dr. Trina Ao order inform patient it is safe to remove packing and that there is only one strand of packing in place. Patient verbalized understanding. Encouraged patient to contact staff with future needs or concerns and she verbalized understanding.

## 2012-06-19 ENCOUNTER — Inpatient Hospital Stay
Admit: 2012-06-19 | Discharge: 2012-06-19 | Disposition: A | Discharge status: Home / Self Care | Payer: Self-pay | Attending: Radiation Oncology | Admitting: Radiation Oncology

## 2012-06-19 ENCOUNTER — Ambulatory Visit (HOSPITAL_BASED_OUTPATIENT_CLINIC_OR_DEPARTMENT_OTHER): Payer: Medicaid Other | Admitting: Anesthesiology

## 2012-06-19 ENCOUNTER — Ambulatory Visit (HOSPITAL_BASED_OUTPATIENT_CLINIC_OR_DEPARTMENT_OTHER)
Admission: RE | Admit: 2012-06-19 | Discharge: 2012-06-19 | Disposition: A | Discharge status: Home / Self Care | Payer: Medicaid Other | Source: Ambulatory Visit | Attending: Radiation Oncology | Admitting: Radiation Oncology

## 2012-06-19 ENCOUNTER — Other Ambulatory Visit: Payer: No Typology Code available for payment source | Admitting: Lab

## 2012-06-19 ENCOUNTER — Ambulatory Visit
Admission: RE | Admit: 2012-06-19 | Discharge: 2012-06-19 | Disposition: A | Discharge status: Home / Self Care | Payer: Medicaid Other | Source: Ambulatory Visit | Attending: Radiation Oncology | Admitting: Radiation Oncology

## 2012-06-19 ENCOUNTER — Ambulatory Visit: Payer: No Typology Code available for payment source | Admitting: Oncology

## 2012-06-19 ENCOUNTER — Encounter (HOSPITAL_BASED_OUTPATIENT_CLINIC_OR_DEPARTMENT_OTHER): Payer: Self-pay | Admitting: *Deleted

## 2012-06-19 ENCOUNTER — Encounter (HOSPITAL_BASED_OUTPATIENT_CLINIC_OR_DEPARTMENT_OTHER)
Admission: RE | Disposition: A | Discharge status: Home / Self Care | Payer: Self-pay | Source: Ambulatory Visit | Attending: Radiation Oncology

## 2012-06-19 ENCOUNTER — Encounter (HOSPITAL_BASED_OUTPATIENT_CLINIC_OR_DEPARTMENT_OTHER): Payer: Self-pay | Admitting: Anesthesiology

## 2012-06-19 ENCOUNTER — Ambulatory Visit (HOSPITAL_COMMUNITY)
Admission: RE | Admit: 2012-06-19 | Discharge: 2012-06-19 | Disposition: A | Discharge status: Home / Self Care | Payer: Medicaid Other | Source: Ambulatory Visit | Attending: Radiation Oncology | Admitting: Radiation Oncology

## 2012-06-19 VITALS — BP 120/76 | HR 81 | Temp 97.8°F | Resp 18

## 2012-06-19 DIAGNOSIS — C539 Malignant neoplasm of cervix uteri, unspecified: Secondary | ICD-10-CM | POA: Insufficient documentation

## 2012-06-19 DIAGNOSIS — I10 Essential (primary) hypertension: Secondary | ICD-10-CM | POA: Insufficient documentation

## 2012-06-19 HISTORY — PX: TANDEM RING INSERTION: SHX6199

## 2012-06-19 SURGERY — INSERTION, UTERINE TANDEM AND RING OR CYLINDER, FOR BRACHYTHERAPY
Anesthesia: General | Site: Cervix | Wound class: Clean Contaminated

## 2012-06-19 MED ORDER — ESTRADIOL 0.1 MG/GM VA CREA
TOPICAL_CREAM | VAGINAL | Status: DC | PRN
  Administered 2012-06-19: 1 via VAGINAL

## 2012-06-19 MED ORDER — PROMETHAZINE HCL 25 MG/ML IJ SOLN
6.2500 mg | INTRAMUSCULAR | Status: DC | PRN
  Filled 2012-06-19: qty 1

## 2012-06-19 MED ORDER — ONDANSETRON HCL 4 MG/2ML IJ SOLN
INTRAMUSCULAR | Status: DC | PRN
  Administered 2012-06-19: 4 mg via INTRAVENOUS

## 2012-06-19 MED ORDER — PROPOFOL 10 MG/ML IV BOLUS
INTRAVENOUS | Status: DC | PRN
  Administered 2012-06-19: 200 mg via INTRAVENOUS

## 2012-06-19 MED ORDER — DEXAMETHASONE SODIUM PHOSPHATE 4 MG/ML IJ SOLN
INTRAMUSCULAR | Status: DC | PRN
  Administered 2012-06-19: 10 mg via INTRAVENOUS

## 2012-06-19 MED ORDER — WATER FOR IRRIGATION, STERILE IR SOLN
Status: DC | PRN
  Administered 2012-06-19: 250 mL via INTRAVESICAL

## 2012-06-19 MED ORDER — MEPERIDINE HCL 25 MG/ML IJ SOLN
6.2500 mg | INTRAMUSCULAR | Status: DC | PRN
  Filled 2012-06-19: qty 1

## 2012-06-19 MED ORDER — MIDAZOLAM HCL 5 MG/5ML IJ SOLN
INTRAMUSCULAR | Status: DC | PRN
  Administered 2012-06-19 (×2): 1 mg via INTRAVENOUS

## 2012-06-19 MED ORDER — FENTANYL CITRATE 0.05 MG/ML IJ SOLN
INTRAMUSCULAR | Status: DC | PRN
  Administered 2012-06-19 (×2): 50 ug via INTRAVENOUS
  Administered 2012-06-19 (×2): 25 ug via INTRAVENOUS

## 2012-06-19 MED ORDER — LIDOCAINE HCL (CARDIAC) 20 MG/ML IV SOLN
INTRAVENOUS | Status: DC | PRN
  Administered 2012-06-19: 80 mg via INTRAVENOUS

## 2012-06-19 MED ORDER — LACTATED RINGERS IV SOLN
INTRAVENOUS | Status: DC
  Administered 2012-06-19 (×2): via INTRAVENOUS
  Filled 2012-06-19: qty 1000

## 2012-06-19 MED ORDER — LACTATED RINGERS IV SOLN
INTRAVENOUS | Status: DC
  Filled 2012-06-19: qty 1000

## 2012-06-19 MED ORDER — FENTANYL CITRATE 0.05 MG/ML IJ SOLN
25.0000 ug | INTRAMUSCULAR | Status: DC | PRN
  Filled 2012-06-19: qty 1

## 2012-06-19 SURGICAL SUPPLY — 32 items
BAG URINE DRAINAGE (UROLOGICAL SUPPLIES) ×3 IMPLANT
BANDAGE CONFORM 2  STR LF (GAUZE/BANDAGES/DRESSINGS) ×1 IMPLANT
CATH FOLEY 2WAY SLVR  5CC 16FR (CATHETERS) ×1
CATH FOLEY 2WAY SLVR 5CC 16FR (CATHETERS) ×1 IMPLANT
CLOTH BEACON ORANGE TIMEOUT ST (SAFETY) ×2 IMPLANT
COVER TABLE BACK 60X90 (DRAPES) ×2 IMPLANT
DRAPE LG THREE QUARTER DISP (DRAPES) ×2 IMPLANT
DRAPE UNDERBUTTOCKS STRL (DRAPE) ×2 IMPLANT
DRSG PAD ABDOMINAL 8X10 ST (GAUZE/BANDAGES/DRESSINGS) ×1 IMPLANT
GLOVE BIO SURGEON STRL SZ7.5 (GLOVE) ×2 IMPLANT
GLOVE INDICATOR 6.5 STRL GRN (GLOVE) ×1 IMPLANT
GOWN PREVENTION PLUS LG XLONG (DISPOSABLE) ×4 IMPLANT
HOLDER FOLEY CATH W/STRAP (MISCELLANEOUS) ×2 IMPLANT
LEGGING LITHOTOMY PAIR STRL (DRAPES) ×2 IMPLANT
NDL SPNL 22GX3.5 QUINCKE BK (NEEDLE) IMPLANT
NEEDLE SPNL 22GX3.5 QUINCKE BK (NEEDLE) IMPLANT
PACK BASIN DAY SURGERY FS (CUSTOM PROCEDURE TRAY) ×2 IMPLANT
PAD OB MATERNITY 4.3X12.25 (PERSONAL CARE ITEMS) ×1 IMPLANT
PAD PREP 24X48 CUFFED NSTRL (MISCELLANEOUS) ×2 IMPLANT
PLUG CATH AND CAP STER (CATHETERS) ×2 IMPLANT
SET IRRIG Y TYPE TUR BLADDER L (SET/KITS/TRAYS/PACK) ×2 IMPLANT
SUT PDS AB 0 CT1 36 (SUTURE) IMPLANT
SUT PROLENE 0 SH 30 (SUTURE) IMPLANT
SUT SILK 2 0 30  PSL (SUTURE)
SUT SILK 2 0 30 PSL (SUTURE) IMPLANT
SYR BULB IRRIGATION 50ML (SYRINGE) IMPLANT
SYR CONTROL 10ML LL (SYRINGE) IMPLANT
SYRINGE 10CC LL (SYRINGE) ×2 IMPLANT
TOWEL OR 17X24 6PK STRL BLUE (TOWEL DISPOSABLE) ×4 IMPLANT
TRAY DSU PREP LF (CUSTOM PROCEDURE TRAY) ×2 IMPLANT
WATER STERILE IRR 3000ML UROMA (IV SOLUTION) ×2 IMPLANT
WATER STERILE IRR 500ML POUR (IV SOLUTION) ×1 IMPLANT

## 2012-06-19 NOTE — Op Note (Signed)
06/19/2012  8:27 AM  PATIENT:  Peggy Pitts  32 y.o. female  PRE-OPERATIVE DIAGNOSIS:  cervical cancer  POST-OPERATIVE DIAGNOSIS:  Cervical cancer  PROCEDURE:  Procedure(s): TANDEM RING INSERTION (N/A)  SURGEON:  Surgeon(s) and Role:    * Billie Lade, MD - Primary  PHYSICIAN ASSISTANT:   ASSISTANTS: none   ANESTHESIA:   general  EBL:  Total I/O In: 100 [I.V.:100] Out: -   BLOOD ADMINISTERED:none  DRAINS: Urinary Catheter (Foley)   LOCAL MEDICATIONS USED:  NONE  SPECIMEN:  No Specimen  DISPOSITION OF SPECIMEN:  N/A  COUNTS:  YES  TOURNIQUET:  * No tourniquets in log *  DICTATION: .Dragon Dictation This is a 32 year old female who was diagnosed with stage II-B cervical cancer. She initially underwent external beam radiation therapy along with radiosensitizing chemotherapy. Patient is now taken to the operating room for her fifth tandem and ring insertion in preparation for her first high-dose-rate treatment with iridium 192.  Patient was prepped and draped in the usual sterile fashion. Gen. anesthesia was used with LMA insertion. A Foley catheter was inserted. On exam under anesthesia the patient was noted to have a good response to her external beam and radiosensitizing chemotherapy. The cervix was estimated to be approximately 4 by 4 cm in size. There was no necrotic tumor noted at the cervix at this time. Erythema was noted along the cervix. There was no obvious extension to the vaginal vault. The patient had approximately 250 cc of sterile water placed in the bladder for imaging purposes. The patient then underwent uterine sounding and dilation. The uterus was estimated to be approximately 8-1/2-9 cm in length. Excellent images were obtained on ultrasound. The uterus was anteverted. Under ultrasound guidance the patient had a 60 mm tandem placed within the uterus. This was the 30 tandem with the maximum curvature to fit the anteverted uterus. Patient then had a 30  34 mm ring placed within the proximal vagina. A rectal paddle was then placed. On ultrasound imaging the tandem was good position within the center of the uterus on sagittal and transverse images. To aid in reducing the rectal dose sterile gauze soaked in Estrace cream was placed in the posterior vagina behind the rectal paddle system The patient tolerated the procedure well. After recovery in anesthesia the patient will be transported to the Radiation Oncology Department for planning and treatment. She will be discharged later in the day after her radiation treatment is complete.       PLAN OF CARE: transfer to rad/onc for planning & treatment  PATIENT DISPOSITION:  PACU - hemodynamically stable.   Delay start of Pharmacological VTE agent (>24hrs) due to surgical blood loss or risk of bleeding: not applicable

## 2012-06-19 NOTE — Progress Notes (Signed)
High-dose-rate brachytherapy procedure note  After planning was complete the patient was transferred to the high dose rate suite.  Simple treatment device note  While in the operating room the patient had construction of her custom ring tandem system.  Verification simulation note  An AP and lateral film was obtained in the treatment position. this was compared to planning films earlier in the day documenting good position of the tandem and ring system for treatment  High-dose-rate brachytherapy note  Patient proceeded to undergo her fifth high-dose-rate treatment. The remote afterloading device was attached to the ring tandem system. The patient was prescribed a dose of 6 Gy to be delivered to [facility] . This was achieved with a total dwell time of 842.8 seconds. patient was treated with 2 channels using 16 dwell positions in the ring system and 6 dwell positions in the tandem. Patient tolerated the procedure well. After completion of her therapy a radiation survey was performed documenting return of the iridium source into the Nucletron safe.  -----------------------------------  Billie Lade, PhD, MD

## 2012-06-19 NOTE — Transfer of Care (Signed)
Immediate Anesthesia Transfer of Care Note  Patient: Peggy Pitts  Procedure(s) Performed: Procedure(s) (LRB): TANDEM RING INSERTION (N/A)  Patient Location: Patient transported to PACU with oxygen via face mask at 4 Liters / Min  Anesthesia Type: General  Level of Consciousness: awake and alert   Airway & Oxygen Therapy: Patient Spontanous Breathing and Patient connected to face mask oxygen  Post-op Assessment: Report given to PACU RN and Post -op Vital signs reviewed and stable  Post vital signs: Reviewed and stable  Dentition: Teeth and oropharynx remain in pre-op condition  Complications: No apparent anesthesia complications

## 2012-06-19 NOTE — Anesthesia Postprocedure Evaluation (Signed)
  Anesthesia Post-op Note  Patient: Peggy Pitts  Procedure(s) Performed: Procedure(s) (LRB): TANDEM RING INSERTION (N/A)  Patient Location: PACU  Anesthesia Type: General  Level of Consciousness: awake and alert   Airway and Oxygen Therapy: Patient Spontanous Breathing  Post-op Pain: mild  Post-op Assessment: Post-op Vital signs reviewed, Patient's Cardiovascular Status Stable, Respiratory Function Stable, Patent Airway and No signs of Nausea or vomiting  Last Vitals:  Filed Vitals:   06/19/12 0945  BP: 117/65  Pulse: 75  Temp:   Resp: 20    Post-op Vital Signs: stable   Complications: No apparent anesthesia complications

## 2012-06-19 NOTE — Anesthesia Preprocedure Evaluation (Signed)
Anesthesia Evaluation  Patient identified by MRN, date of birth, ID band Patient awake    Reviewed: Allergy & Precautions, H&P , NPO status , Patient's Chart, lab work & pertinent test results  Airway Mallampati: II TM Distance: >3 FB Neck ROM: full    Dental no notable dental hx. (+) Teeth Intact and Dental Advisory Given   Pulmonary neg pulmonary ROS,  breath sounds clear to auscultation  Pulmonary exam normal       Cardiovascular Exercise Tolerance: Good hypertension, negative cardio ROS  Rhythm:regular Rate:Normal     Neuro/Psych negative neurological ROS  negative psych ROS   GI/Hepatic negative GI ROS, Neg liver ROS,   Endo/Other  negative endocrine ROSMorbid obesity  Renal/GU negative Renal ROS   Neoplasm Cervix negative genitourinary   Musculoskeletal   Abdominal   Peds  Hematology negative hematology ROS (+)   Anesthesia Other Findings   Reproductive/Obstetrics negative OB ROS Cervical Ca.                           Anesthesia Physical  Anesthesia Plan  ASA: II  Anesthesia Plan: General   Post-op Pain Management:    Induction: Intravenous  Airway Management Planned: LMA  Additional Equipment:   Intra-op Plan:   Post-operative Plan: Extubation in OR  Informed Consent: I have reviewed the patients History and Physical, chart, labs and discussed the procedure including the risks, benefits and alternatives for the proposed anesthesia with the patient or authorized representative who has indicated his/her understanding and acceptance.   Dental advisory given  Plan Discussed with: CRNA  Anesthesia Plan Comments:         Anesthesia Quick Evaluation

## 2012-06-19 NOTE — Progress Notes (Signed)
Simulation note  Patient was brought down from the outpatient surgical center. She was placed on the CT simulator table. A fiducial markers were placed within the ring tandem system. Patient proceeded to undergo CT scan in the treatment position. The tandem was in good position within the uterus. Patient will undergo contouring of the ring/ tandem as well as bladder, rectum sigmoid colon and small bowel. She will proceed with her fifth high-dose-rate treatment with a dose of approximately 6 Gy to point A.  -----------------------------------   Billie Lade, PhD, MD

## 2012-06-19 NOTE — Anesthesia Procedure Notes (Signed)
Procedure Name: LMA Insertion Date/Time: 06/19/2012 7:45 AM Performed by: Fran Lowes Pre-anesthesia Checklist: Patient identified, Emergency Drugs available, Suction available and Patient being monitored Patient Re-evaluated:Patient Re-evaluated prior to inductionOxygen Delivery Method: Circle System Utilized Preoxygenation: Pre-oxygenation with 100% oxygen Intubation Type: IV induction Ventilation: Mask ventilation without difficulty LMA: LMA inserted LMA Size: 4.0 Number of attempts: 1 Airway Equipment and Method: bite block Placement Confirmation: positive ETCO2 Tube secured with: Tape Dental Injury: Teeth and Oropharynx as per pre-operative assessment

## 2012-06-19 NOTE — H&P (Signed)
Pt examined, chart reviewed. Pt is ready for surgery.  All questions answered. Pt understands procedure.

## 2012-06-20 ENCOUNTER — Other Ambulatory Visit: Payer: Self-pay | Admitting: Oncology

## 2012-06-20 ENCOUNTER — Encounter (HOSPITAL_BASED_OUTPATIENT_CLINIC_OR_DEPARTMENT_OTHER): Payer: Self-pay | Admitting: Radiation Oncology

## 2012-06-20 DIAGNOSIS — C539 Malignant neoplasm of cervix uteri, unspecified: Secondary | ICD-10-CM

## 2012-06-21 ENCOUNTER — Telehealth: Payer: Self-pay

## 2012-06-21 MED FILL — Lactated Ringer's Solution: INTRAVENOUS | Qty: 1000 | Status: AC

## 2012-06-21 NOTE — Telephone Encounter (Signed)
Ms. Peggy Pitts called to see when her next appt.iIs with Dr. Darrold Span.  Told her that Dr. Darrold Span sent orders to the schedulers to set up an appt. In 6-8 weeks.  Told Peggy Pitts to callour scheduling department if she has has not heard about her appt. By 06-30-12.  Pt. Verbalized understanding.

## 2012-06-22 NOTE — Progress Notes (Signed)
Late entry from 06/19/2012 at 1500. Transported patient from recovery to CT/SIM at 0930. Following SIM patient transported to nursing. Vitals WDL. SCDs applied. Patient alert and oriented to person, place and time. No distress noted. Patient resting supine in hospital bd with HOB slightly elevated 30 degrees. Tandem ovoid in place. Foley cathetter to gravity drain with clear yellow urine in collection back. SR up x2. Patient denies pain. Call bell in place. At 1110 emptied 1000 cc of clear yellow urine from collection bag. 1130 provided patient with regular lunch tray which she ate 100% of and tolerated well. Patient completed and tolerated well final HDR treatment around 1500. Received patient back in nursing.Discontinued complete 1000 cc bag of Lactated Ringers that had been infusing by gravity drain.   Removed left hand IV. Catheter intact upon removal. Applied occlusive dressing to site. Emptied 600 cc of clear yellow urine from foley drainage bag. Discontinued foley catheter as ordered by Dr. Roselind Messier. Patient tolerated well. Encouraged patient to ambulate, increase fluid intake and contact staff if she has not voided within 4 hours. Patient verbalized understanding. Assisted Dr. Roselind Messier as he removed tandem ovoid equipment and packing. Patient tolerated well. Patient dressed in street clothes. One month follow up appointment card given. Encouraged patient to contact staff with needs. Patient verbalized understanding. Wheeled patient to lobby for discharge home with her brother.

## 2012-06-22 NOTE — Addendum Note (Signed)
Encounter addended by: Agnes Lawrence, RN on: 06/22/2012 10:57 AM<BR>     Documentation filed: Vitals Section, Notes Section, Inpatient Document Flowsheet

## 2012-06-26 ENCOUNTER — Telehealth: Payer: Self-pay | Admitting: Oncology

## 2012-07-08 ENCOUNTER — Encounter: Payer: Self-pay | Admitting: Radiation Oncology

## 2012-07-08 NOTE — Progress Notes (Signed)
  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  Indication for treatment:  Definitive treatment along with radiosensitizing chemotherapy       Radiation treatment dates:   External beam radiation therapy 04/05/2013 through 05/17/2012;  intracavitary brachytherapy treatments on January 27, February 3, February 10, February 17, February 24  Site/dose:   Pelvis and periaortic area 45 Gy in 25 fractions, the cervix region was boosted with intracavitary brachytherapy treatments using iridium 192 as the high-dose-rate source, the patient received 5 weekly treatments at 6 Gy per fraction  Beams/energy:   Helical intensity modulated radiation therapy using 6 MV photons,  iridium 192 as the high-dose-rate brachy therapy source  Narrative: The patient tolerated radiation treatment relatively well.   The patient did experience nausea and diarrhea with her external beam treatments. In addition she was found to have a C. difficile infection which resulted in a delay of her treatments. On exam under anesthesia the patient was noted to have a good response to her initial external beam and radiosensitizing chemotherapy.  Plan: The patient has completed radiation treatment. The patient will return to radiation oncology clinic for routine followup in one month. I advised them to call or return sooner if they have any questions or concerns related to their recovery or treatment.  -----------------------------------  Billie Lade, PhD, MD

## 2012-07-19 ENCOUNTER — Other Ambulatory Visit: Payer: Self-pay | Admitting: Oncology

## 2012-07-19 DIAGNOSIS — C539 Malignant neoplasm of cervix uteri, unspecified: Secondary | ICD-10-CM

## 2012-07-19 NOTE — Progress Notes (Signed)
Medical Oncology  Status from Dr Trina Ao notes reviewed. Urgent new patient whom this MD needs to see 08-01-12 so have sent POF to schedulers to move Ms Peggy Pitts off of 08-01-12 to Dr Darrold Span in mid-late April, or to midlevel sooner if concerns, with cbc cmet mg at visit.  Ila Mcgill, MD

## 2012-07-20 ENCOUNTER — Telehealth: Payer: Self-pay | Admitting: Oncology

## 2012-07-20 ENCOUNTER — Encounter: Payer: Self-pay | Admitting: Certified Registered Nurse Anesthetist

## 2012-07-20 NOTE — Patient Instructions (Signed)
Late entry for 05/08/2012. No end time noted for IVF on this date.  IVF started at 1245hr and pt. Discharged at 1603hr. IVF order 376ml/hr.  Charge should be 3hrs of IVF for this DOS. HL

## 2012-07-20 NOTE — Progress Notes (Unsigned)
Late entry for 05/08/2012.  No end time noted on IVF given on this date.  IVF start time noted at 1245hr and pt. Was discharged at 1603hr.  Order 379ml/hr.  IVF infusion charge should be 3hrs.  Coding notified.  HL

## 2012-08-01 ENCOUNTER — Ambulatory Visit: Payer: No Typology Code available for payment source | Admitting: Oncology

## 2012-08-01 ENCOUNTER — Other Ambulatory Visit: Payer: No Typology Code available for payment source | Admitting: Lab

## 2012-08-13 ENCOUNTER — Other Ambulatory Visit: Payer: Self-pay | Admitting: Oncology

## 2012-08-14 ENCOUNTER — Other Ambulatory Visit: Payer: Self-pay

## 2012-08-14 ENCOUNTER — Telehealth: Payer: Self-pay | Admitting: Oncology

## 2012-08-14 ENCOUNTER — Encounter: Payer: Self-pay | Admitting: Oncology

## 2012-08-14 ENCOUNTER — Other Ambulatory Visit (HOSPITAL_BASED_OUTPATIENT_CLINIC_OR_DEPARTMENT_OTHER): Payer: No Typology Code available for payment source | Admitting: Lab

## 2012-08-14 ENCOUNTER — Encounter: Payer: Self-pay | Admitting: Lab

## 2012-08-14 ENCOUNTER — Ambulatory Visit (HOSPITAL_BASED_OUTPATIENT_CLINIC_OR_DEPARTMENT_OTHER): Payer: No Typology Code available for payment source | Admitting: Oncology

## 2012-08-14 VITALS — BP 130/70 | HR 63 | Temp 97.9°F | Resp 20 | Ht 66.0 in | Wt 225.8 lb

## 2012-08-14 DIAGNOSIS — D509 Iron deficiency anemia, unspecified: Secondary | ICD-10-CM

## 2012-08-14 DIAGNOSIS — C539 Malignant neoplasm of cervix uteri, unspecified: Secondary | ICD-10-CM

## 2012-08-14 LAB — COMPREHENSIVE METABOLIC PANEL (CC13)
ALT: 22 U/L (ref 0–55)
BUN: 6.9 mg/dL — ABNORMAL LOW (ref 7.0–26.0)
CO2: 27 mEq/L (ref 22–29)
Calcium: 10.3 mg/dL (ref 8.4–10.4)
Chloride: 104 mEq/L (ref 98–107)
Creatinine: 0.9 mg/dL (ref 0.6–1.1)
Glucose: 79 mg/dl (ref 70–99)
Total Bilirubin: 0.44 mg/dL (ref 0.20–1.20)

## 2012-08-14 LAB — CBC WITH DIFFERENTIAL/PLATELET
BASO%: 0.4 % (ref 0.0–2.0)
Basophils Absolute: 0 10*3/uL (ref 0.0–0.1)
HCT: 34.5 % — ABNORMAL LOW (ref 34.8–46.6)
HGB: 11.3 g/dL — ABNORMAL LOW (ref 11.6–15.9)
LYMPH%: 25.9 % (ref 14.0–49.7)
MCH: 27.6 pg (ref 25.1–34.0)
MCHC: 32.8 g/dL (ref 31.5–36.0)
MONO#: 0.2 10*3/uL (ref 0.1–0.9)
NEUT%: 62.9 % (ref 38.4–76.8)
Platelets: 208 10*3/uL (ref 145–400)
WBC: 2.6 10*3/uL — ABNORMAL LOW (ref 3.9–10.3)
lymph#: 0.7 10*3/uL — ABNORMAL LOW (ref 0.9–3.3)

## 2012-08-14 LAB — MAGNESIUM (CC13): Magnesium: 2.1 mg/dl (ref 1.5–2.5)

## 2012-08-14 MED ORDER — ONDANSETRON HCL 8 MG PO TABS: 8.0000 mg | ORAL_TABLET | Freq: Two times a day (BID) | ORAL | Status: DC | PRN

## 2012-08-14 NOTE — Progress Notes (Signed)
OFFICE PROGRESS NOTE   08/14/2012   Physicians:P.Danny Lawless.Debroah Loop, J.Kinard   INTERVAL HISTORY:   Patient is seen, together with husband, in follow up of sensitizing CDDP chemotherapy given with radiation for IIB cervical carcinoma, treatment completed in Jan 2014. She feels generally well now, with no symptoms that seem related to the chemotherapy.   Patient presented to ED 01-11-12 with complaints of foul vaginal odor x 2 weeks, cultures negative for chlamydia and GC and treatment given with rocephin and zithromax. She returned to ED on 02-22-12 with ongoing problem, with finding of large mass obstructing cervical os. She was referred to Dr Scheryl Darter, with hx of last PAP several years previously; biopsy of the cervical mass showed invasive poorly differentiated squamous cell carcinoma (path [id number]). She had transabdominal and transvaginal US during this evaluation. CT CAP 03-13-12 had probable residual thymic tissue in mediastinum, 7 cm cervical mass with poor fat plane between this and posterior bladder, bilateral internal iliac and left pelvic sidewall adenopathy. She saw Dr Duard Brady on 03-15-12, clinical stage IIB. PET 03-22-12, wih uptake in retroperitoneal and pelvic nodes and the cervical mass; she also had mild fullness of right renal collecting system. She received external beam radiation by Dr Roselind Messier from 04-05-12 thru 05-17-12 which was 45 cGy to pelvix and periaortics, then 5 weekly HDR from 1-27 thru 06-19-12. She had 3 weekly treatments of cisplatin 12-16, 12-30 and 05-01-12, treatment complicated by hypokalemia and hypomagnesemia from diarrhea in addition to the CDDP. Diarrhea was first thought due to RT, then on 05-03-12 was found to be C difficile positive. RT was delayed and chemo held with the C difficile colitis.  Patient's only complaint today is of intermittent, localized pain in right inguinal region which seems more apparent when she is standing. She has some clear vaginal  drainage without odor, bleeding or itching. She has had no menstrual period since ~ Nov 2013. Bowels are moving normally, no bladder symptoms, no pelvic discomfort. Appetite and energy are good. No respiratory problems. No other bleeding. No LE swelling. She has had no fever or symptoms of infection. Remainder of 10 point Review of Systems negative.  Objective:  Vital signs in last 24 hours:  BP 130/70  Pulse 63  Temp(Src) 97.9 F (36.6 C) (Oral)  Resp 20  Ht 5\' 6"  (1.676 m)  Wt 225 lb 12.8 oz (102.422 kg)  BMI 36.46 kg/m2 Easily mobile, looks comfortable. Weight is up 8 lbs.   HEENT:PERRLA, sclera clear, anicteric and oropharynx clear, no lesions. Normal hair pattern. LymphaticsCervical, supraclavicular, and axillary nodes normal. No inguinal adenopathy. Resp: clear to auscultation bilaterally and normal percussion bilaterally Cardio: regular rate and rhythm GI: obese, soft, nontender, no appreciable HSM or mass No tenderness, mass or obvious hernia right inguinal area. Extremities: extremities normal, atraumatic, no cyanosis or edema Neuro:no sensory deficits noted Skin without rash or ecchymosis  Lab Results:  Results for orders placed in visit on 08/14/12  CBC WITH DIFFERENTIAL      Result Value Range   WBC 2.6 (*) 3.9 - 10.3 10e3/uL   NEUT# 1.7  1.5 - 6.5 10e3/uL   HGB 11.3 (*) 11.6 - 15.9 g/dL   HCT 78.2 (*) 95.6 - 21.3 %   Platelets 208  145 - 400 10e3/uL   MCV 84.2  79.5 - 101.0 fL   MCH 27.6  25.1 - 34.0 pg   MCHC 32.8  31.5 - 36.0 g/dL   RBC 0.86  5.78 - 4.69 10e6/uL   RDW  13.8  11.2 - 14.5 %   lymph# 0.7 (*) 0.9 - 3.3 10e3/uL   MONO# 0.2  0.1 - 0.9 10e3/uL   Eosinophils Absolute 0.1  0.0 - 0.5 10e3/uL   Basophils Absolute 0.0  0.0 - 0.1 10e3/uL   NEUT% 62.9  38.4 - 76.8 %   LYMPH% 25.9  14.0 - 49.7 %   MONO% 8.6  0.0 - 14.0 %   EOS% 2.2  0.0 - 7.0 %   BASO% 0.4  0.0 - 2.0 %  COMPREHENSIVE METABOLIC PANEL (CC13)      Result Value Range   Sodium 142  136  - 145 mEq/L   Potassium 3.7  3.5 - 5.1 mEq/L   Chloride 104  98 - 107 mEq/L   CO2 27  22 - 29 mEq/L   Glucose 79  70 - 99 mg/dl   BUN 6.9 (*) 7.0 - 78.2 mg/dL   Creatinine 0.9  0.6 - 1.1 mg/dL   Total Bilirubin 9.56  0.20 - 1.20 mg/dL   Alkaline Phosphatase 79  40 - 150 U/L   AST 21  5 - 34 U/L   ALT 22  0 - 55 U/L   Total Protein 9.1 (*) 6.4 - 8.3 g/dL   Albumin 4.2  3.5 - 5.0 g/dL   Calcium 21.3  8.4 - 08.6 mg/dL  MAGNESIUM (VH84)      Result Value Range   Magnesium 2.1  1.5 - 2.5 mg/dl    Hgb improved from last labs in Feb 2014, WBC not back to pretreatment level. I have suggested she resume oral iron for present.  Studies/Results:  No results found.  Medications: I have reviewed the patient's current medications.Iron as above.  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 She is due back to Dr Roselind Messier as she did not have one month follow up for the RT; she is not presently scheduled for scans or back to gyn oncology, and I will defer both of these to Dr Roselind Messier when he sees her next. I will see her back to follow up blood counts particularly In ~ 3 months. Resume oral iron. 2.history of C difficile colitis, clinically resolved  3.obesity 4.BTL  Patient was comfortable with discussion and plan abovel     Aubrianna Orchard P, MD   08/14/2012, 12:52 PM

## 2012-08-14 NOTE — Telephone Encounter (Signed)
gv pt appt schedule for July and appt w/Dr. Roselind Messier for 4/24 @ 10:50am. S/w Clydie Braun in radonc - Clydie Braun will add appt.

## 2012-08-17 ENCOUNTER — Ambulatory Visit: Payer: MEDICAID | Admitting: Radiation Oncology

## 2012-08-21 ENCOUNTER — Encounter: Payer: Self-pay | Admitting: Radiation Oncology

## 2012-08-21 ENCOUNTER — Ambulatory Visit
Admission: RE | Admit: 2012-08-21 | Discharge: 2012-08-21 | Disposition: A | Discharge status: Home / Self Care | Payer: No Typology Code available for payment source | Source: Ambulatory Visit | Attending: Radiation Oncology | Admitting: Radiation Oncology

## 2012-08-21 VITALS — BP 124/83 | HR 88 | Temp 98.0°F | Resp 16 | Wt 226.4 lb

## 2012-08-21 DIAGNOSIS — C539 Malignant neoplasm of cervix uteri, unspecified: Secondary | ICD-10-CM

## 2012-08-21 NOTE — Progress Notes (Signed)
  Radiation Oncology         ([phone]) [phone] ________________________________  Name: Peggy Pitts MRN: [id number]  Date: 08/21/2012  DOB: [birth year]  Follow-Up Visit Note  CC: Default, Provider, MD  Reece Packer, MD, Cleda Mccreedy, MD  Diagnosis:   Stage II-B squamous cell carcinoma cervix  Interval Since Last Radiation:  2  months  Narrative:  The patient returns today for routine follow-up.  She is doing well at this time. She denies any vaginal bleeding or discharge. She denies any urination difficulties hematuria or rectal bleeding or problems with her bowels. She occasionally will notice some discomfort in the right groin area typically when riding in a car on a bumpy road.  She also notices some discomfort in this area occasionally with coughing.                              ALLERGIES:  is allergic to shellfish allergy and adhesive.  Meds: Current Outpatient Prescriptions  Medication Sig Dispense Refill  . naproxen sodium (ANAPROX) 220 MG tablet Take 220 mg by mouth 2 (two) times daily as needed.      . ondansetron (ZOFRAN) 8 MG tablet Take 1-2 tablets (8-16 mg total) by mouth every 12 (twelve) hours as needed for nausea (Non drowsy).  10 tablet  0   No current facility-administered medications for this encounter.   Facility-Administered Medications Ordered in Other Encounters  Medication Dose Route Frequency Provider Last Rate Last Dose  . 0.9 %  sodium chloride infusion  250 mL Intravenous Once Lennis Buzzy Han, MD      . acetaminophen (TYLENOL) tablet 650 mg  650 mg Oral Once Lennis Buzzy Han, MD        Physical Findings: The patient is in no acute distress. Patient is alert and oriented.  weight is 226 lb 6.4 oz (102.694 kg). Her oral temperature is 98 F (36.7 C). Her blood pressure is 124/83 and her pulse is 88. Her respiration is 16 and oxygen saturation is 100%. .  No palpable supraclavicular adenopathy. The lungs are clear to auscultation. The heart has a  regular rhythm and rate. The abdomen is soft and nontender with normal bowel sounds. There is no inguinal adenopathy appreciated. There is no obvious the hernia appreciated along the right groin area.   Lab Findings: Lab Results  Component Value Date   WBC 2.6* 08/14/2012   HGB 11.3* 08/14/2012   HCT 34.5* 08/14/2012   MCV 84.2 08/14/2012   PLT 208 08/14/2012     Radiographic Findings: No results found.  Impression:  The patient is recovering from the effects of radiation.    Plan:  The patient will be seen in gynecologic oncology in one month. This will be the patient's first pelvic exam and Pap smear since completion of her therapy. Patient will return to radiation oncology in 4 months for exam and Pap smear. Today the patient was given a vaginal dilator and instructions on its use in light of her pelvic and intracavitary brachytherapy treatments.  _____________________________________  -----------------------------------  Billie Lade, PhD, MD

## 2012-08-21 NOTE — Progress Notes (Signed)
Patient presents to the clinic today accompanied by her significant other for follow up with Dr. Roselind Messier. Patient alert and oriented to person, place, and time. No distress noted. Steady gait noted. Pleasant affect noted. Patient denies pain at this time. However, patient reports intermittent groin pain. Patient denies vaginal discharge, odor or itching. Patient denies diarrhea. Patient denies burning with urination. Patient denies hematuria. Patient reports normal energy level and that's she is active. Reported all findings to Dr. Roselind Messier.   Follow up with Dr. Darrold Span 7/21

## 2012-08-21 NOTE — Progress Notes (Signed)
Provided patient with M plus vaginal dilator and water based lubricant as directed by Dr. Roselind Messier. Educated patient upon use. Patient verbalized understanding.

## 2012-10-05 ENCOUNTER — Encounter: Payer: Self-pay | Admitting: Gynecologic Oncology

## 2012-10-05 ENCOUNTER — Ambulatory Visit: Payer: No Typology Code available for payment source | Attending: Gynecologic Oncology | Admitting: Gynecologic Oncology

## 2012-10-05 ENCOUNTER — Other Ambulatory Visit (HOSPITAL_COMMUNITY)
Admission: RE | Admit: 2012-10-05 | Discharge: 2012-10-05 | Disposition: A | Discharge status: Home / Self Care | Payer: No Typology Code available for payment source | Source: Ambulatory Visit | Attending: Gynecologic Oncology | Admitting: Gynecologic Oncology

## 2012-10-05 VITALS — BP 124/70 | HR 88 | Temp 98.2°F | Resp 20 | Ht 66.0 in | Wt 229.0 lb

## 2012-10-05 DIAGNOSIS — Z01419 Encounter for gynecological examination (general) (routine) without abnormal findings: Secondary | ICD-10-CM | POA: Insufficient documentation

## 2012-10-05 DIAGNOSIS — C539 Malignant neoplasm of cervix uteri, unspecified: Secondary | ICD-10-CM | POA: Insufficient documentation

## 2012-10-05 DIAGNOSIS — Z79899 Other long term (current) drug therapy: Secondary | ICD-10-CM | POA: Insufficient documentation

## 2012-10-05 DIAGNOSIS — Z923 Personal history of irradiation: Secondary | ICD-10-CM | POA: Insufficient documentation

## 2012-10-05 NOTE — Progress Notes (Signed)
Consult Note: Gyn-Onc  Peggy Pitts 32 y.o. female  CC:  Chief Complaint  Patient presents with  . Cervical Cancer    follow up    HPI:   Patient is a 32 year old gravida 3 para 3 who has not had a Pap smear approximately 7 years. At the end of September she began having some vaginal discharge and went to the emergency room where she was told should bacterial vaginosis. She was treated with antibiotics for 14 days. While the odor diminished, the discharge continued. The odor came back and she was again seen in the emergency room on November 4. 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 In addition, she also had transvaginal ultrasound revealed an irregular heterogeneous mass present at the level of the cervix and within the vagina measuring 6.7 x 4.8 x 5.1 cm in size.  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 She had a CT scan performed recently that revealed the following:  CT CHEST  Findings: Ill-defined anterior mediastinal tissue likely represents residual thymic tissue given its indistinct margination.  Small axillary lymph nodes do not appear pathologically enlarged. No pathologic mediastinal adenopathy noted. No significant pulmonary nodules.  IMPRESSION:  1. No findings of malignancy in the thorax.  CT ABDOMEN AND PELVIS  Findings: The liver, spleen, pancreas, and adrenal glands appear unremarkable. The gallbladder and biliary system appear unremarkable.  Small scattered retroperitoneal lymph nodes are not pathologically enlarged by size criteria. Several pericecal lymph nodes are also  within normal limits. Scattered small mesenteric lymph nodes are within normal limits. Left upper internal iliac lymph node measures 1.7 cm in short axis on image 91 of series 2, slight surrounding stranding. A similar right sided internal iliac node measures 1.4 cm in short axis on  image 94 of  series 2. Multiple additional pathologically enlarged pelvic lymph nodes include a 1.8 cm short axis right internal iliac  node and a 1.4 cm short axis left pelvic sidewall node on image 102 of series 2.   Large cervical mass measures 7 cm transversely. Poor fat plane definition noted between this mass in the posterior urinary bladder  wall. A nonspecific 2.3 cm hypodense lesion of the left ovary is observed. The right ovary is somewhat indistinct. Small bilateral inguinal lymph nodes are not pathologically enlarged by size criteria.  IMPRESSION:  1. Cervical mass with bilateral internal iliac and left pelvic sidewall pathologic adenopathy. A definite intact fat plane between the mass in the posterior urinary bladder is difficult to ensure. The mass does not appear to directly involve the rectum.  2. Probable low density cystic lesion of the inferior left ovary, probably a follicle or small complex cyst  Interval History: She was treated with radiation with concurrent chemosensitization. She did have a PET CT 03/22/2012 that revealed uptake in the retroperitoneal and pelvic nodes and a cervical mass. She also had mild fullness of the right renal collecting system. She received external beam radiation with Dr. Sharlett Iles from 04/05/2012 through 05/17/2012 which was 45 cGy to the pelvis and periaortic region. She then had 5 weekly high-dose rate treatments from January 27 through 06/17/2011. She had 2 weekly treatments of cisplatin on December 16, December 30, and 05/02/2011. It was commented about hypokalemia and hypomagnesemia for diarrhea in addition to his cisplatin. That was initially thought to be due to radiation therapy and then on January 8 she was found to be C.  difficile positive. The radiation and chemotherapy were held for the C. difficile colitis. She was last seen by Dr. Darrold Span on April 21 and was seen by Dr. Roselind Messier on the 4/28th. She comes in today to see me for the first pelvic and surveillance  exam.  Review of Systems: She is currently sexually active. She has a 28 year old son and a 32 year old daughter and a 10-year-old son. She has lost quite a bit of weight into account about 200 pounds from 249 her weight is up to 229. She's no longer have any bleeding discharge or odor. She is not having any pain with intercourse. Her husband is a Naval architect and when he is in town the R. sexually active and is been no pain or bleeding. She does use her dilators occasionally. She occasionally has some right lower quadrant pain with coughing. She occasionally has some soreness in the right lower quadrant with walking. His been going on for about one to 2 month has not changed any fashion.  Current Meds:  Outpatient Encounter Prescriptions as of 10/05/2012  Medication Sig Dispense Refill  . naproxen sodium (ANAPROX) 220 MG tablet Take 220 mg by mouth 2 (two) times daily as needed.      . ondansetron (ZOFRAN) 8 MG tablet Take 1-2 tablets (8-16 mg total) by mouth every 12 (twelve) hours as needed for nausea (Non drowsy).  10 tablet  0   Facility-Administered Encounter Medications as of 10/05/2012  Medication Dose Route Frequency Provider Last Rate Last Dose  . 0.9 %  sodium chloride infusion  250 mL Intravenous Once Lennis P Livesay, MD      . acetaminophen (TYLENOL) tablet 650 mg  650 mg Oral Once Lennis Buzzy Han, MD        Allergy:  Allergies  Allergen Reactions  . Shellfish Allergy Swelling    itching  . 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 Marital Status: Married    Spouse Name: N/A    Number of Children: 3  . Years of Education: N/A   Occupational History  . CNA     [employer]   Social History Main Topics  . Smoking status: Never Smoker   . Smokeless tobacco: Never Used  . Alcohol Use: Yes     Comment: occ  . Drug Use: No  . Sexually Active: Not Currently   Other  Topics Concern  . Not on file   Social History Narrative  . No narrative on file    Past Surgical Hx: She has had 3 spontaneous vaginal deliveries. Past Surgical History  Procedure Laterality Date  . Tubal ligation  2006  . Cervical biopsy  02/28/2012    poorly differentiated squamous cell ca  . Tandem ring insertion  05/22/2012    Procedure: TANDEM RING INSERTION;  Surgeon: Billie Lade, MD;  Location: [facility];  Service: Urology;  Laterality: N/A;  . Tandem ring insertion  05/29/2012    Procedure: TANDEM RING INSERTION;  Surgeon: Billie Lade, MD;  Location: [facility];  Service: Urology;  Laterality: N/A;  . Tandem ring insertion N/A 06/05/2012    Procedure: TANDEM RING INSERTION;  Surgeon: Billie Lade, MD;  Location: [facility];  Service: Urology;  Laterality: N/A;  . Tandem ring insertion N/A 06/12/2012    Procedure: TANDEM  RING INSERTION;  Surgeon: Billie Lade, MD;  Location: [facility];  Service: Urology;  Laterality: N/A;  . Tandem ring insertion N/A 06/19/2012    Procedure: TANDEM RING INSERTION;  Surgeon: Billie Lade, MD;  Location: [facility];  Service: Urology;  Laterality: N/A;    Past Medical Hx:  Past Medical History  Diagnosis Date  . Cervical cancer     Squamous cell  . Anemia    None  Family Hx:  Family History  Problem Relation Age of Onset  . Diabetes Mother   . Hypertension Mother    Her mother has had hypertension and diabetes. There are no GYN cancers in the family. All her siblings are alive and well and her children are alive and well with no medical problems.  Vitals:  Blood pressure 124/70, pulse 88, temperature 98.2 F (36.8 C), temperature source Oral, resp. rate 20, height 5\' 6"  (1.676 m), weight 229 lb (103.874 kg), last menstrual period 03/07/2012.  Physical Exam: Nourished well developed female in no acute distress. BMI is 40.5.  Neck: Supple, no  lymphadenopathy, no thyromegaly.  Lungs: Clear to auscultation bilaterally.  Cardiovascular tachycardic regular rhythm no murmurs.  Abdomen: Soft, nontender, nondistended. There is no palpable masses or hepatosplenomegaly. Palpation over the area of tenderness does not reveal any hernias that are appreciated.  Groins: No lymphadenopathy.  Extremities: No edema.  Pelvic: External genitalia within normal limits. Vagina is atrophic. The anterior lip of the cervix is visualized are no visible lesions whatsoever. I cannot adequately visualize the posterior lip the cervix. Pap smear was submitted without difficulty. Bimanual examination reveals palpably normal cervix. She had a dramatic response. The uterus is not appreciably enlarged. There is no nodularity. Rectal confirms.   Assessment/Plan:  32 year old with stage IIB cervical carcinoma status post definitive chemoradiation. She completed her treatment in approximately February of 2014. We'll followup in results of her Pap smear from today. She'll see Dr. Roselind Messier in 3 months and return to see me 3 months after that visit.   Berdell Hostetler A., MD 10/05/2012, 4:05 PM

## 2012-10-11 ENCOUNTER — Telehealth: Payer: Self-pay | Admitting: *Deleted

## 2012-10-11 NOTE — Telephone Encounter (Signed)
Notified patient of Normal Pap results. Pt was advise to call  GYN Oncology at [phone] and speak to Warner Mccreedy, NP if there were any questions

## 2012-11-01 ENCOUNTER — Telehealth: Payer: Self-pay | Admitting: Hematology and Oncology

## 2012-11-01 NOTE — Telephone Encounter (Signed)
lmonvm advising the pt of her appt on 11/13/2012@10 :00am.

## 2012-11-13 ENCOUNTER — Other Ambulatory Visit: Payer: No Typology Code available for payment source | Admitting: Lab

## 2012-11-13 ENCOUNTER — Ambulatory Visit: Payer: No Typology Code available for payment source

## 2012-12-22 ENCOUNTER — Encounter: Payer: Self-pay | Admitting: Radiation Oncology

## 2012-12-22 DIAGNOSIS — Z923 Personal history of irradiation: Secondary | ICD-10-CM | POA: Insufficient documentation

## 2012-12-28 ENCOUNTER — Ambulatory Visit: Payer: No Typology Code available for payment source | Admitting: Radiation Oncology

## 2013-01-22 ENCOUNTER — Ambulatory Visit: Payer: No Typology Code available for payment source | Attending: Radiation Oncology | Admitting: Radiation Oncology

## 2013-02-22 ENCOUNTER — Ambulatory Visit
Admission: RE | Admit: 2013-02-22 | Discharge: 2013-02-22 | Disposition: A | Discharge status: Home / Self Care | Payer: No Typology Code available for payment source | Source: Ambulatory Visit | Attending: Radiation Oncology | Admitting: Radiation Oncology

## 2013-02-22 ENCOUNTER — Other Ambulatory Visit (HOSPITAL_COMMUNITY)
Admission: RE | Admit: 2013-02-22 | Discharge: 2013-02-22 | Disposition: A | Discharge status: Home / Self Care | Payer: No Typology Code available for payment source | Source: Ambulatory Visit | Attending: Radiation Oncology | Admitting: Radiation Oncology

## 2013-02-22 ENCOUNTER — Encounter: Payer: Self-pay | Admitting: Radiation Oncology

## 2013-02-22 VITALS — BP 135/85 | HR 83 | Temp 97.8°F | Ht 66.0 in | Wt 220.3 lb

## 2013-02-22 DIAGNOSIS — Z01419 Encounter for gynecological examination (general) (routine) without abnormal findings: Secondary | ICD-10-CM | POA: Insufficient documentation

## 2013-02-22 DIAGNOSIS — C539 Malignant neoplasm of cervix uteri, unspecified: Secondary | ICD-10-CM

## 2013-02-22 DIAGNOSIS — Z1151 Encounter for screening for human papillomavirus (HPV): Secondary | ICD-10-CM | POA: Insufficient documentation

## 2013-02-22 NOTE — Progress Notes (Signed)
Radiation Oncology         ([phone]) [phone] ________________________________  Name: Peggy Pitts MRN: [id number]  Date: 02/22/2013  DOB: [birth year]  Follow-Up Visit Note  CC: Default, Provider, MD  Laurette Schimke, MD  Diagnosis:   Stage II-B squamous cell carcinoma of the cervix  Interval Since Last Radiation:  8  months  Narrative:  The patient returns today for routine follow-up.  She seems to be doing well at this time. She did see gynecologic oncology 3 months ago with the good report. Patient denies any hematuria or rectal bleeding. She denies any bowel problems. She does notice occasional spotting after intercourse. She denies any pain with intercourse. She does occasionally have some discomfort in the right groin with standing but no actual pain.  she cannot feel a mass or swelling in this area.  She has been inconsistent with using her vaginal dilator but is sexually active as above. She denies any discharge or odor at this time.                        ALLERGIES:  is allergic to shellfish allergy and adhesive.  Meds: Current Outpatient Prescriptions  Medication Sig Dispense Refill  . naproxen sodium (ANAPROX) 220 MG tablet Take 220 mg by mouth 2 (two) times daily as needed.      . ondansetron (ZOFRAN) 8 MG tablet Take 1-2 tablets (8-16 mg total) by mouth every 12 (twelve) hours as needed for nausea (Non drowsy).  10 tablet  0   No current facility-administered medications for this encounter.   Facility-Administered Medications Ordered in Other Encounters  Medication Dose Route Frequency Provider Last Rate Last Dose  . 0.9 %  sodium chloride infusion  250 mL Intravenous Once Lennis Buzzy Han, MD      . acetaminophen (TYLENOL) tablet 650 mg  650 mg Oral Once Lennis Buzzy Han, MD        Physical Findings: The patient is in no acute distress. Patient is alert and oriented.  height is 5\' 6"  (1.676 m) and weight is 220 lb 4.8 oz (99.927 kg). Her temperature is 97.8 F (36.6  C). Her blood pressure is 135/85 and her pulse is 83. Marland Kitchen  No palpable supraclavicular or axillary adenopathy. The lungs are clear to auscultation. The heart has a regular rhythm and rate. The abdomen is soft and nontender with normal bowel sounds. The inguinal areas are free of adenopathy. I am unable to detect any hernia along the right groin area. On pelvic examination the external genitalia are unremarkable. A speculum exam is performed. There some radiation changes noted in the proximal vagina and cervical region. No obvious mucosal lesion. A Pap smear was obtained of the cervical area. The tissue does bleed easily during the Pap smear.. No significant bleeding however. On bimanual and rectovaginal examination the cervix appears normal in size without any suspicious masses palpable on exam.  Lab Findings: Lab Results  Component Value Date   WBC 2.6* 08/14/2012   HGB 11.3* 08/14/2012   HCT 34.5* 08/14/2012   MCV 84.2 08/14/2012   PLT 208 08/14/2012      Radiographic Findings: No results found.  Impression:  No evidence of recurrence on clinical exam today, Pap smear pending  Plan:  Followup in gynecologic oncology in 3 months. Followup in radiation oncology in 6 months.  The patient at some point may require repeat imaging to follow up on her retroperitoneal/pelvic nodal metastasis.  _____________________________________  -----------------------------------  Blair Promise, PhD, MD

## 2013-02-22 NOTE — Progress Notes (Signed)
Peggy Pitts here with her husband for follow up after treatment for cervical cancer.  She denies pain, vaginal bleeding and discharge, diarrhea and bladder issues.  She says she was fatigued but is back to normal.

## 2013-04-05 ENCOUNTER — Ambulatory Visit: Payer: No Typology Code available for payment source | Attending: Gynecologic Oncology | Admitting: Gynecologic Oncology

## 2013-04-12 ENCOUNTER — Ambulatory Visit: Payer: No Typology Code available for payment source | Admitting: Gynecologic Oncology

## 2013-05-24 ENCOUNTER — Ambulatory Visit: Payer: No Typology Code available for payment source | Admitting: Gynecologic Oncology

## 2013-06-12 ENCOUNTER — Telehealth: Payer: Self-pay | Admitting: *Deleted

## 2013-06-12 NOTE — Telephone Encounter (Signed)
Pt called not available spoke with Doylene Canning who answered phone, lm for pt her appt r/s due to weather to 3/12 at 0945. Sherren Mocha advised he would give pt message and have her call us to confirm.

## 2013-06-14 ENCOUNTER — Ambulatory Visit: Payer: No Typology Code available for payment source | Admitting: Gynecologic Oncology

## 2013-07-05 ENCOUNTER — Encounter: Payer: Self-pay | Admitting: Gynecologic Oncology

## 2013-07-05 ENCOUNTER — Ambulatory Visit: Payer: 59 | Attending: Gynecologic Oncology | Admitting: Gynecologic Oncology

## 2013-07-05 ENCOUNTER — Other Ambulatory Visit (HOSPITAL_COMMUNITY)
Admission: RE | Admit: 2013-07-05 | Discharge: 2013-07-05 | Disposition: A | Discharge status: Home / Self Care | Payer: 59 | Source: Ambulatory Visit | Attending: Gynecologic Oncology | Admitting: Gynecologic Oncology

## 2013-07-05 VITALS — BP 141/88 | HR 92 | Temp 98.3°F | Resp 20 | Ht 66.0 in | Wt 236.1 lb

## 2013-07-05 DIAGNOSIS — Z01419 Encounter for gynecological examination (general) (routine) without abnormal findings: Secondary | ICD-10-CM | POA: Insufficient documentation

## 2013-07-05 DIAGNOSIS — Z8249 Family history of ischemic heart disease and other diseases of the circulatory system: Secondary | ICD-10-CM | POA: Insufficient documentation

## 2013-07-05 DIAGNOSIS — Z833 Family history of diabetes mellitus: Secondary | ICD-10-CM | POA: Insufficient documentation

## 2013-07-05 DIAGNOSIS — Z923 Personal history of irradiation: Secondary | ICD-10-CM | POA: Insufficient documentation

## 2013-07-05 DIAGNOSIS — Z9221 Personal history of antineoplastic chemotherapy: Secondary | ICD-10-CM | POA: Insufficient documentation

## 2013-07-05 DIAGNOSIS — C539 Malignant neoplasm of cervix uteri, unspecified: Secondary | ICD-10-CM | POA: Insufficient documentation

## 2013-07-05 NOTE — Progress Notes (Signed)
Consult Note: Gyn-Onc  Peggy Pitts 33 y.o. female  CC:  Chief Complaint  Patient presents with  . Cervical Cancer    Follow up    HPI: Patient is a 33 year old gravida 3 para 3 who had not had a Pap smear approximately 7 years. At the end of September 2013 she began having some vaginal discharge and went to the emergency room where she was told should bacterial vaginosis. She was treated with antibiotics for 14 days. While the odor diminished, the discharge continued. The odor came back and she was again seen in the emergency room on November 4. 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 In addition, she also had transvaginal ultrasound revealed an irregular heterogeneous mass present at the level of the cervix and within the vagina measuring 6.7 x 4.8 x 5.1 cm in size. 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 She had a CT scan performed that revealed the following:  CT CHEST  Findings: Ill-defined anterior mediastinal tissue likely represents residual thymic tissue given its indistinct margination.  Small axillary lymph nodes do not appear pathologically enlarged. No pathologic mediastinal adenopathy noted. No significant pulmonary nodules.  IMPRESSION:  1. No findings of malignancy in the thorax.   CT ABDOMEN AND PELVIS  Findings: The liver, spleen, pancreas, and adrenal glands appear unremarkable. The gallbladder and biliary system appear unremarkable.  Small scattered retroperitoneal lymph nodes are not pathologically enlarged by size criteria. Several pericecal lymph nodes are also  within normal limits. Scattered small mesenteric lymph nodes are within normal limits. Left upper internal iliac lymph node measures 1.7 cm in short axis, slight surrounding stranding. A similar right sided internal iliac node measures 1.4 cm in short axis on. Multiple additional pathologically enlarged  pelvic lymph nodes include a 1.8 cm short axis right internal iliac  node and a 1.4 cm short axis left pelvic sidewall node.  Large cervical mass measures 7 cm transversely. Poor fat plane definition noted between this mass in the posterior urinary bladder  wall. A nonspecific 2.3 cm hypodense lesion of the left ovary is observed. The right ovary is somewhat indistinct. Small bilateral inguinal lymph nodes are not pathologically enlarged by size criteria.   IMPRESSION:  1. Cervical mass with bilateral internal iliac and left pelvic sidewall pathologic adenopathy. A definite intact fat plane between the mass in the posterior urinary bladder is difficult to ensure. The mass does not appear to directly involve the rectum.  2. Probable low density cystic lesion of the inferior left ovary, probably a follicle or small complex cyst   She was treated with radiation with concurrent chemosensitization. She did have a PET CT 03/22/2012 that revealed uptake in the retroperitoneal and pelvic nodes and a cervical mass. She also had mild fullness of the right renal collecting system. She received external beam radiation with Dr. Sondra Come from 04/05/2012 through 05/17/2012 which was 45 cGy to the pelvis and periaortic region. She then had 5 weekly high-dose rate treatments from January 27 through 06/16/2012. She had 2 weekly treatments of cisplatin on April 10, 2012, April 24, 2012, and 05/01/2012. It was commented about hypokalemia and hypomagnesemia for diarrhea in addition to his cisplatin. That was initially thought to be due to radiation therapy and then on May 03, 2012 she was found to be C. difficile positive. The radiation and chemotherapy were held for the C. difficile colitis. She was last seen by  Dr. Sondra Come 10/14. Pap smear shows ASCUS with negative HR HPV.  Interval History:  She is overall doing fairly well. She did have an episode of bleeding this past Tuesday afterintercourse. She is quite  embarrassed by this but states that she and her husband were using "toys". There was no pain but there was bleeding. She does experience some bleeding using her vaginal dilators as well. She denies any significant change in her bowel bladder habits. She denies any other abdominal or pelvic pain. There are no new medical problems and her family.  Review of Systems:  Constitutional: Denies fever. Skin: No rash Cardiovascular: No chest pain Pulmonary: No cough or wheeze.  Gastro Intestinal: Reporting intermittent lower abdominal soreness.  No nausea, vomiting, constipation, or diarrhea reported. No bright red blood per rectum or change in bowel movement.  Genitourinary: No frequency, urgency, or dysuria.  + vaginal bleeding and discharge.  Musculoskeletal: No myalgia, arthralgia, joint swelling or pain.  Neurologic: No weakness, numbness, or change in gait.  Psychology: No changes  Current Meds:  Outpatient Encounter Prescriptions as of 07/05/2013  Medication Sig  . naproxen sodium (ANAPROX) 220 MG tablet Take 220 mg by mouth 2 (two) times daily as needed.  . ondansetron (ZOFRAN) 8 MG tablet Take 1-2 tablets (8-16 mg total) by mouth every 12 (twelve) hours as needed for nausea (Non drowsy).    Allergy:  Allergies  Allergen Reactions  . Shellfish Allergy Swelling    itching  . Adhesive [Tape] Rash    Paper tape ok    Social Hx:   History   Social History  . Marital Status: Married    Spouse Name: N/A    Number of Children: 3  . Years of Education: N/A   Occupational History  . CNA     [employer]   Social History Main Topics  . Smoking status: Never Smoker   . Smokeless tobacco: Never Used  . Alcohol Use: Yes     Comment: occ  . Drug Use: No  . Sexual Activity: Not Currently   Other Topics Concern  . Not on file   Social History Narrative  . No narrative on file    Past Surgical Hx:  Past Surgical History  Procedure Laterality Date  . Tubal ligation   2006  . Cervical biopsy  02/28/2012    poorly differentiated squamous cell ca  . Tandem ring insertion  05/22/2012    Procedure: TANDEM RING INSERTION;  Surgeon: Blair Promise, MD;  Location: [facility];  Service: Urology;  Laterality: N/A;  . Tandem ring insertion  05/29/2012    Procedure: TANDEM RING INSERTION;  Surgeon: Blair Promise, MD;  Location: [facility];  Service: Urology;  Laterality: N/A;  . Tandem ring insertion N/A 06/05/2012    Procedure: TANDEM RING INSERTION;  Surgeon: Blair Promise, MD;  Location: [facility];  Service: Urology;  Laterality: N/A;  . Tandem ring insertion N/A 06/12/2012    Procedure: TANDEM RING INSERTION;  Surgeon: Blair Promise, MD;  Location: [facility];  Service: Urology;  Laterality: N/A;  . Tandem ring insertion N/A 06/19/2012    Procedure: TANDEM RING INSERTION;  Surgeon: Blair Promise, MD;  Location: [facility];  Service: Urology;  Laterality: N/A;    Past Medical Hx:  Past Medical History  Diagnosis Date  . Cervical cancer     Squamous cell  . Anemia   . Hx of  radiation therapy 04/05/12- 05/17/12, external beam, HDR 1/27, 2/3, 2/10, 2/17, 06/19/12    pelvis periaortic area 45 gray 25 fx, cervix boosted w/HDR x 5 treatmetns at 6 gray per fraction    Oncology Hx:    Cervical cancer   03/06/2012 Initial Diagnosis Cervical cancer, IIB SCCA   04/05/2012 - 05/17/2012 Radiation Therapy Vaginal brachytherapy 05/22/12-06/16/12   04/10/2012 - 05/01/2012 Chemotherapy Cisplatin    Family Hx:  Family History  Problem Relation Age of Onset  . Diabetes Mother   . Hypertension Mother     Vitals:  Blood pressure 141/88, pulse 92, temperature 98.3 F (36.8 C), temperature source Oral, resp. rate 20, height 5\' 6"  (1.676 m), weight 236 lb 1.6 oz (107.094 kg), last menstrual period 03/07/2012, SpO2 100.00%.  Physical Exam: Nourished well developed female in no acute distress.   Neck: Supple, no lymphadenopathy, no thyromegaly.  Lungs: Clear to auscultation bilaterally.  Cardiovascular tachycardic regular rhythm no murmurs.  Abdomen: Soft, nontender, nondistended. There is no palpable masses or hepatosplenomegaly.  Groins: No lymphadenopathy.  Extremities: No edema.  Pelvic: External genitalia within normal limits. Vagina is atrophic. The anterior lip of the cervix is visualized are no visible lesions. I cannot adequately visualize the posterior lip the cervix. Pap smear was submitted without difficulty. Bimanual examination reveals palpably normal cervix. The uterus is not appreciably enlarged. There is no nodularity. Rectal deferred due to a large amount of stool in the vault.   Assessment/Plan:  33 year old with stage IIB cervical carcinoma status post definitive chemoradiation. She completed her treatment in approximately February of 2014. We'll followup in results of her Pap smear from today. She'll see Dr. Sondra Come in 3 months and return to see me 3 months after that visit. She states that Dr. Sondra Come was going to get a CT secondary to some right groin pain to rule out a hernia. We have ordered that today. We'll also get chest x-rays part of routine surveillance for her cervical cancer.      Kylee Nardozzi A., MD 07/05/2013, 3:50 PM

## 2013-07-05 NOTE — Patient Instructions (Signed)
We'll notify you of the results of your Pap smear. Please followup with Dr. Alycia Rossetti in 6 months in followup with Dr. Sondra Come in 3

## 2013-07-06 ENCOUNTER — Other Ambulatory Visit (HOSPITAL_BASED_OUTPATIENT_CLINIC_OR_DEPARTMENT_OTHER): Payer: 59

## 2013-07-06 ENCOUNTER — Ambulatory Visit (HOSPITAL_COMMUNITY)
Admission: RE | Admit: 2013-07-06 | Discharge: 2013-07-06 | Disposition: A | Discharge status: Home / Self Care | Payer: 59 | Source: Ambulatory Visit | Attending: Gynecologic Oncology | Admitting: Gynecologic Oncology

## 2013-07-06 ENCOUNTER — Telehealth: Payer: Self-pay | Admitting: *Deleted

## 2013-07-06 DIAGNOSIS — C539 Malignant neoplasm of cervix uteri, unspecified: Secondary | ICD-10-CM

## 2013-07-06 LAB — CBC WITH DIFFERENTIAL/PLATELET
BASO%: 0.6 % (ref 0.0–2.0)
Basophils Absolute: 0 10*3/uL (ref 0.0–0.1)
EOS%: 4.9 % (ref 0.0–7.0)
Eosinophils Absolute: 0.2 10*3/uL (ref 0.0–0.5)
HCT: 35.4 % (ref 34.8–46.6)
HGB: 11.6 g/dL (ref 11.6–15.9)
LYMPH%: 34.4 % (ref 14.0–49.7)
MCH: 26.6 pg (ref 25.1–34.0)
MCHC: 32.8 g/dL (ref 31.5–36.0)
MCV: 81 fL (ref 79.5–101.0)
MONO#: 0.3 10*3/uL (ref 0.1–0.9)
MONO%: 5.6 % (ref 0.0–14.0)
NEUT#: 2.7 10*3/uL (ref 1.5–6.5)
NEUT%: 54.5 % (ref 38.4–76.8)
PLATELETS: 237 10*3/uL (ref 145–400)
RBC: 4.37 10*6/uL (ref 3.70–5.45)
RDW: 14.7 % — ABNORMAL HIGH (ref 11.2–14.5)
WBC: 5 10*3/uL (ref 3.9–10.3)
lymph#: 1.7 10*3/uL (ref 0.9–3.3)

## 2013-07-06 LAB — COMPREHENSIVE METABOLIC PANEL (CC13)
ALK PHOS: 87 U/L (ref 40–150)
ALT: 16 U/L (ref 0–55)
AST: 13 U/L (ref 5–34)
Albumin: 4.2 g/dL (ref 3.5–5.0)
Anion Gap: 10 mEq/L (ref 3–11)
BUN: 26.2 mg/dL — ABNORMAL HIGH (ref 7.0–26.0)
CO2: 27 mEq/L (ref 22–29)
Calcium: 10 mg/dL (ref 8.4–10.4)
Chloride: 103 mEq/L (ref 98–109)
Creatinine: 1 mg/dL (ref 0.6–1.1)
GLUCOSE: 85 mg/dL (ref 70–140)
Potassium: 3.7 mEq/L (ref 3.5–5.1)
SODIUM: 140 meq/L (ref 136–145)
TOTAL PROTEIN: 8.6 g/dL — AB (ref 6.4–8.3)

## 2013-07-06 NOTE — Telephone Encounter (Signed)
Per NP, notified pt chest xray no active cardiopulmonary disease, no evidence of metastatic disease. Pt verbalized understanding.

## 2013-07-09 ENCOUNTER — Telehealth: Payer: Self-pay | Admitting: *Deleted

## 2013-07-09 NOTE — Telephone Encounter (Signed)
Notified pt Pap Smear normal. Pt verbalized understanding.

## 2013-07-09 NOTE — Telephone Encounter (Signed)
Message copied by GARNER, Aletha Halim on Mon Jul 09, 2013  5:01 PM ------      Message from: CROSS, MELISSA D      Created: Mon Jul 09, 2013  3:57 PM       Please let the patient know that her pap smear is normal.  Thanks!      ----- Message -----         From: [facility] Interface         Sent: 07/09/2013   3:10 PM           To: Dorothyann Gibbs, NP                   ------

## 2013-07-10 ENCOUNTER — Encounter: Payer: Self-pay | Admitting: Gynecologic Oncology

## 2013-07-11 ENCOUNTER — Ambulatory Visit (HOSPITAL_COMMUNITY): Payer: 59

## 2013-07-11 ENCOUNTER — Encounter: Payer: Self-pay | Admitting: Gynecologic Oncology

## 2013-07-11 ENCOUNTER — Ambulatory Visit (HOSPITAL_COMMUNITY)
Admission: RE | Admit: 2013-07-11 | Discharge: 2013-07-11 | Disposition: A | Discharge status: Home / Self Care | Payer: 59 | Source: Ambulatory Visit | Attending: Gynecologic Oncology | Admitting: Gynecologic Oncology

## 2013-07-11 DIAGNOSIS — R1031 Right lower quadrant pain: Secondary | ICD-10-CM | POA: Insufficient documentation

## 2013-07-11 DIAGNOSIS — R188 Other ascites: Secondary | ICD-10-CM | POA: Insufficient documentation

## 2013-07-11 DIAGNOSIS — C539 Malignant neoplasm of cervix uteri, unspecified: Secondary | ICD-10-CM | POA: Insufficient documentation

## 2013-07-11 DIAGNOSIS — K4090 Unilateral inguinal hernia, without obstruction or gangrene, not specified as recurrent: Secondary | ICD-10-CM | POA: Insufficient documentation

## 2013-07-11 MED ORDER — IOHEXOL 300 MG/ML  SOLN
100.0000 mL | Freq: Once | INTRAMUSCULAR | Status: AC | PRN
  Administered 2013-07-11: 100 mL via INTRAVENOUS

## 2013-08-23 ENCOUNTER — Other Ambulatory Visit: Payer: Self-pay | Admitting: Gynecologic Oncology

## 2013-08-23 ENCOUNTER — Telehealth: Payer: Self-pay | Admitting: Gynecologic Oncology

## 2013-08-23 DIAGNOSIS — Z9189 Other specified personal risk factors, not elsewhere classified: Secondary | ICD-10-CM

## 2013-08-23 DIAGNOSIS — R197 Diarrhea, unspecified: Secondary | ICD-10-CM

## 2013-08-23 NOTE — Progress Notes (Signed)
Patient called with stating she has a co-worker who has c diff and she feels that she has been exposed.  She is having frequent stools.  No other complaints voiced.  Wanting to be tested.  She is to bring her stool sample in today for c diff testing and stool culture.  Advised to increase her fluid intake.  Reportable signs and symptoms reviewed.

## 2013-08-23 NOTE — Telephone Encounter (Signed)
Duplicate

## 2013-08-24 ENCOUNTER — Other Ambulatory Visit: Payer: 59

## 2014-01-13 ENCOUNTER — Emergency Department (HOSPITAL_COMMUNITY): Payer: 59

## 2014-01-13 ENCOUNTER — Emergency Department (HOSPITAL_COMMUNITY)
Admission: EM | Admit: 2014-01-13 | Discharge: 2014-01-13 | Disposition: A | Discharge status: Home / Self Care | Payer: 59 | Attending: Emergency Medicine | Admitting: Emergency Medicine

## 2014-01-13 ENCOUNTER — Encounter (HOSPITAL_COMMUNITY): Payer: Self-pay | Admitting: Emergency Medicine

## 2014-01-13 DIAGNOSIS — R112 Nausea with vomiting, unspecified: Secondary | ICD-10-CM | POA: Diagnosis not present

## 2014-01-13 DIAGNOSIS — Z862 Personal history of diseases of the blood and blood-forming organs and certain disorders involving the immune mechanism: Secondary | ICD-10-CM | POA: Diagnosis not present

## 2014-01-13 DIAGNOSIS — R000 Tachycardia, unspecified: Secondary | ICD-10-CM | POA: Insufficient documentation

## 2014-01-13 DIAGNOSIS — Z923 Personal history of irradiation: Secondary | ICD-10-CM | POA: Insufficient documentation

## 2014-01-13 DIAGNOSIS — J189 Pneumonia, unspecified organism: Secondary | ICD-10-CM

## 2014-01-13 DIAGNOSIS — R197 Diarrhea, unspecified: Secondary | ICD-10-CM | POA: Insufficient documentation

## 2014-01-13 DIAGNOSIS — R42 Dizziness and giddiness: Secondary | ICD-10-CM | POA: Diagnosis present

## 2014-01-13 DIAGNOSIS — J159 Unspecified bacterial pneumonia: Secondary | ICD-10-CM | POA: Diagnosis not present

## 2014-01-13 DIAGNOSIS — Z8541 Personal history of malignant neoplasm of cervix uteri: Secondary | ICD-10-CM | POA: Insufficient documentation

## 2014-01-13 LAB — CBC WITH DIFFERENTIAL/PLATELET
BASOS ABS: 0 10*3/uL (ref 0.0–0.1)
Basophils Relative: 0 % (ref 0–1)
EOS PCT: 0 % (ref 0–5)
Eosinophils Absolute: 0 10*3/uL (ref 0.0–0.7)
HEMATOCRIT: 35.3 % — AB (ref 36.0–46.0)
HEMOGLOBIN: 11.9 g/dL — AB (ref 12.0–15.0)
LYMPHS PCT: 32 % (ref 12–46)
Lymphs Abs: 0.8 10*3/uL (ref 0.7–4.0)
MCH: 26.8 pg (ref 26.0–34.0)
MCHC: 33.7 g/dL (ref 30.0–36.0)
MCV: 79.5 fL (ref 78.0–100.0)
MONO ABS: 0.2 10*3/uL (ref 0.1–1.0)
MONOS PCT: 8 % (ref 3–12)
Neutro Abs: 1.6 10*3/uL — ABNORMAL LOW (ref 1.7–7.7)
Neutrophils Relative %: 60 % (ref 43–77)
Platelets: 133 10*3/uL — ABNORMAL LOW (ref 150–400)
RBC: 4.44 MIL/uL (ref 3.87–5.11)
RDW: 14.8 % (ref 11.5–15.5)
WBC: 2.7 10*3/uL — AB (ref 4.0–10.5)

## 2014-01-13 LAB — URINALYSIS, ROUTINE W REFLEX MICROSCOPIC
Bilirubin Urine: NEGATIVE
Glucose, UA: NEGATIVE mg/dL
KETONES UR: 15 mg/dL — AB
LEUKOCYTES UA: NEGATIVE
Nitrite: NEGATIVE
PROTEIN: 100 mg/dL — AB
Specific Gravity, Urine: 1.025 (ref 1.005–1.030)
UROBILINOGEN UA: 1 mg/dL (ref 0.0–1.0)
pH: 6 (ref 5.0–8.0)

## 2014-01-13 LAB — COMPREHENSIVE METABOLIC PANEL
ALT: 11 U/L (ref 0–35)
ANION GAP: 16 — AB (ref 5–15)
AST: 22 U/L (ref 0–37)
Albumin: 4.1 g/dL (ref 3.5–5.2)
Alkaline Phosphatase: 69 U/L (ref 39–117)
BILIRUBIN TOTAL: 0.3 mg/dL (ref 0.3–1.2)
BUN: 11 mg/dL (ref 6–23)
CALCIUM: 9.1 mg/dL (ref 8.4–10.5)
CO2: 25 meq/L (ref 19–32)
CREATININE: 1.04 mg/dL (ref 0.50–1.10)
Chloride: 96 mEq/L (ref 96–112)
GFR, EST AFRICAN AMERICAN: 80 mL/min — AB (ref >90)
GFR, EST NON AFRICAN AMERICAN: 69 mL/min — AB (ref >90)
GLUCOSE: 100 mg/dL — AB (ref 70–99)
Potassium: 3.7 mEq/L (ref 3.7–5.3)
Sodium: 137 mEq/L (ref 137–147)
Total Protein: 8.8 g/dL — ABNORMAL HIGH (ref 6.0–8.3)

## 2014-01-13 LAB — URINE MICROSCOPIC-ADD ON

## 2014-01-13 MED ORDER — SODIUM CHLORIDE 0.9 % IV SOLN
Freq: Once | INTRAVENOUS | Status: AC
  Administered 2014-01-13: 20 mL/h via INTRAVENOUS

## 2014-01-13 MED ORDER — HYDROCODONE-HOMATROPINE 5-1.5 MG/5ML PO SYRP: 5.0000 mL | ORAL_SOLUTION | Freq: Four times a day (QID) | ORAL | Status: DC | PRN

## 2014-01-13 MED ORDER — LEVOFLOXACIN 500 MG PO TABS: 500.0000 mg | ORAL_TABLET | Freq: Every day | ORAL | Status: DC

## 2014-01-13 MED ORDER — KETOROLAC TROMETHAMINE 30 MG/ML IJ SOLN
30.0000 mg | Freq: Once | INTRAMUSCULAR | Status: AC
  Administered 2014-01-13: 30 mg via INTRAVENOUS
  Filled 2014-01-13: qty 1

## 2014-01-13 MED ORDER — ONDANSETRON HCL 4 MG/2ML IJ SOLN
4.0000 mg | Freq: Once | INTRAMUSCULAR | Status: AC
  Administered 2014-01-13: 4 mg via INTRAVENOUS
  Filled 2014-01-13: qty 2

## 2014-01-13 MED ORDER — ACETAMINOPHEN 325 MG PO TABS
650.0000 mg | ORAL_TABLET | Freq: Once | ORAL | Status: AC
  Administered 2014-01-13: 650 mg via ORAL
  Filled 2014-01-13: qty 2

## 2014-01-13 NOTE — ED Provider Notes (Signed)
CSN: [id number]     Arrival date & time 01/13/14  0093 History   First MD Initiated Contact with Patient 01/13/14 0617     Chief Complaint  Patient presents with  . Dizziness  . Diarrhea     (Consider location/radiation/quality/duration/timing/severity/associated sxs/prior Treatment) HPI Comments: Patient is a 33 year old female with no significant past medical history who presents with a 3 day history of cough. Symptoms started gradually and progressively worsened since the onset. Patient's cough is productive with while phlegm. Patient reports associated subjective fever, lightheadedness, diarrhea, and chills. Patient has not tried anything at home for symptoms. 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 She reports associated aching pain in bilateral shoulders and torso. No other associated symptoms. No medication given at home. No aggravating/alleviating factors.    Past Medical History  Diagnosis Date  . Cervical cancer     Squamous cell  . Anemia   . Hx of radiation therapy 04/05/12- 05/17/12, external beam, HDR 1/27, 2/3, 2/10, 2/17, 06/19/12    pelvis periaortic area 45 gray 25 fx, cervix boosted w/HDR x 5 treatmetns at 6 gray per fraction   Past Surgical History  Procedure Laterality Date  . Tubal ligation  2006  . Cervical biopsy  02/28/2012    poorly differentiated squamous cell ca  . Tandem ring insertion  05/22/2012    Procedure: TANDEM RING INSERTION;  Surgeon: Blair Promise, MD;  Location: [facility];  Service: Urology;  Laterality: N/A;  . Tandem ring insertion  05/29/2012    Procedure: TANDEM RING INSERTION;  Surgeon: Blair Promise, MD;  Location: [facility];  Service: Urology;  Laterality: N/A;  . Tandem ring insertion N/A 06/05/2012    Procedure: TANDEM RING INSERTION;  Surgeon: Blair Promise, MD;  Location: [facility];  Service: Urology;  Laterality: N/A;  . Tandem ring insertion  N/A 06/12/2012    Procedure: TANDEM RING INSERTION;  Surgeon: Blair Promise, MD;  Location: [facility];  Service: Urology;  Laterality: N/A;  . Tandem ring insertion N/A 06/19/2012    Procedure: TANDEM RING INSERTION;  Surgeon: Blair Promise, MD;  Location: [facility];  Service: Urology;  Laterality: N/A;   Family History  Problem Relation Age of Onset  . Diabetes Mother   . Hypertension Mother    History  Substance Use Topics  . Smoking status: Never Smoker   . Smokeless tobacco: Never Used  . Alcohol Use: Yes     Comment: occ   OB History   Grav Para Term Preterm Abortions TAB SAB Ect Mult Living   3 3 3       3      Review of Systems  Constitutional: Positive for fever. Negative for chills and fatigue.  HENT: Negative for trouble swallowing.   Eyes: Negative for visual disturbance.  Respiratory: Positive for cough. Negative for shortness of breath.   Cardiovascular: Negative for chest pain and palpitations.  Gastrointestinal: Positive for nausea, vomiting and diarrhea. Negative for abdominal pain.  Genitourinary: Negative for dysuria and difficulty urinating.  Musculoskeletal: Negative for arthralgias and neck pain.  Skin: Negative for color change.  Neurological: Positive for light-headedness. Negative for dizziness and weakness.  Psychiatric/Behavioral: Negative for dysphoric mood.      Allergies  Shellfish allergy and Adhesive  Home Medications   Prior to Admission medications   Medication Sig Start Date End Date  Taking? Authorizing Provider  ibuprofen (ADVIL,MOTRIN) 200 MG tablet Take 200 mg by mouth every 6 (six) hours as needed.   Yes Historical Provider, MD   BP 132/72  Pulse 115  Temp(Src) 102.1 F (38.9 C) (Oral)  Resp 24  Ht 5\' 6"  (1.676 m)  Wt 228 lb 2 oz (103.477 kg)  BMI 36.84 kg/m2  SpO2 99%  LMP 03/07/2012 Physical Exam  Nursing note and vitals reviewed. Constitutional: She is oriented to person, place, and  time. She appears well-developed and well-nourished. No distress.  HENT:  Head: Normocephalic and atraumatic.  Nose: Nose normal.  Mouth/Throat: Oropharynx is clear and moist. No oropharyngeal exudate.  Eyes: Conjunctivae and EOM are normal.  Neck: Normal range of motion.  Cardiovascular: Regular rhythm.  Exam reveals no gallop and no friction rub.   No murmur heard. tachycardic  Pulmonary/Chest: Effort normal and breath sounds normal. She has no wheezes. She has no rales. She exhibits no tenderness.  Abdominal: Soft. She exhibits no distension. There is no tenderness. There is no rebound and no guarding.  Musculoskeletal: Normal range of motion.  Neurological: She is alert and oriented to person, place, and time. Coordination normal.  Speech is goal-oriented. Moves limbs without ataxia.   Skin: Skin is warm and dry.  Psychiatric: She has a normal mood and affect. Her behavior is normal.    ED Course  Procedures (including critical care time) Labs Review Labs Reviewed  CBC WITH DIFFERENTIAL - Abnormal; Notable for the following:    WBC 2.7 (*)    Hemoglobin 11.9 (*)    HCT 35.3 (*)    Platelets 133 (*)    Neutro Abs 1.6 (*)    All other components within normal limits  COMPREHENSIVE METABOLIC PANEL - Abnormal; Notable for the following:    Glucose, Bld 100 (*)    Total Protein 8.8 (*)    GFR calc non Af Amer 69 (*)    GFR calc Af Amer 80 (*)    Anion gap 16 (*)    All other components within normal limits  URINALYSIS, ROUTINE W REFLEX MICROSCOPIC - Abnormal; Notable for the following:    APPearance CLOUDY (*)    Hgb urine dipstick TRACE (*)    Ketones, ur 15 (*)    Protein, ur 100 (*)    All other components within normal limits  URINE MICROSCOPIC-ADD ON - Abnormal; Notable for the following:    Squamous Epithelial / LPF FEW (*)    Casts HYALINE CASTS (*)    All other components within normal limits    Imaging Review Dg Chest 2 View  01/13/2014   CLINICAL DATA:   Chest pain, fever, cough, vomiting.  EXAM: CHEST  2 VIEW  COMPARISON:  07/06/2013.  FINDINGS: Normal sized heart. Decreased inspiration with interval minimal linear density at the left lateral lung base. Otherwise, clear lungs. Unremarkable bones.  IMPRESSION: Decreased inspiration with minimal left basilar atelectasis.   Electronically Signed   By: Enrique Sack M.D.   On: 01/13/2014 07:14     EKG Interpretation None      MDM   Final diagnoses:  CAP (community acquired pneumonia)    6:39 AM Labs, urinalysis and chest xray pending. Patient will have fluids and toradol for her symptoms. Patient is febrile at 102.1 and tachycardic at 115.   8:18 AM Patient's labs show low WBC which is baseline for the patient. No acute lab changes. Urinalysis shows no signs of infection. Chest xray shows minimal  left basilar atelectasis. Patient has no risk factor for PE. Patient likely has developing pneumonia. Patient will be treated for CAP with antibiotics and hycodan. Patient advised to drink plenty of fluids and rest. Patient instructed to return to the ED with worsening or concerning symptoms.    Alvina Chou, PA-C 01/13/14 1318

## 2014-01-13 NOTE — ED Notes (Signed)
Pt. Is not in room

## 2014-01-13 NOTE — ED Notes (Signed)
C/o abd pain, bilateral side pain, fever, nvd, cough (clear phlegm) & dizziness. abd soft NT, last BM  PTA (diarrhea, watery, brown), last ate Saturday morning, took ibuprofen last at 1700, no other meds PTA, family at [facility].

## 2014-01-13 NOTE — Discharge Instructions (Signed)
Take Levaquin as directed until gone. Take hycodan as needed for cough. Refer to attached documents for more information. Return to the ED with worsening or concerning symptoms.

## 2014-01-13 NOTE — ED Notes (Signed)
Pt to xray

## 2014-01-13 NOTE — ED Notes (Signed)
EDPA in to room to see pt.

## 2014-01-13 NOTE — ED Notes (Signed)
Pt. presents with multiple complaints : dizziness , diarrhea , chills, emesis and pain at bilateral shoulders and both sides of torso onset 3 days ago . Febrile at triage .

## 2014-01-13 NOTE — ED Notes (Signed)
Belongings with pt

## 2014-01-14 NOTE — ED Provider Notes (Signed)
Medical screening examination/treatment/procedure(s) were performed by non-physician practitioner and as supervising physician I was immediately available for consultation/collaboration.     Veryl Speak, MD 01/14/14 1230

## 2014-01-17 ENCOUNTER — Encounter (HOSPITAL_COMMUNITY): Payer: Self-pay | Admitting: Emergency Medicine

## 2014-01-17 ENCOUNTER — Emergency Department (HOSPITAL_COMMUNITY)
Admission: EM | Admit: 2014-01-17 | Discharge: 2014-01-17 | Disposition: A | Discharge status: Home / Self Care | Payer: 59 | Attending: Emergency Medicine | Admitting: Emergency Medicine

## 2014-01-17 ENCOUNTER — Emergency Department (HOSPITAL_COMMUNITY): Payer: 59

## 2014-01-17 DIAGNOSIS — Z923 Personal history of irradiation: Secondary | ICD-10-CM | POA: Diagnosis not present

## 2014-01-17 DIAGNOSIS — R112 Nausea with vomiting, unspecified: Secondary | ICD-10-CM | POA: Diagnosis present

## 2014-01-17 DIAGNOSIS — R197 Diarrhea, unspecified: Secondary | ICD-10-CM | POA: Insufficient documentation

## 2014-01-17 DIAGNOSIS — Z3202 Encounter for pregnancy test, result negative: Secondary | ICD-10-CM | POA: Insufficient documentation

## 2014-01-17 DIAGNOSIS — Z9851 Tubal ligation status: Secondary | ICD-10-CM | POA: Diagnosis not present

## 2014-01-17 DIAGNOSIS — Z792 Long term (current) use of antibiotics: Secondary | ICD-10-CM | POA: Diagnosis not present

## 2014-01-17 DIAGNOSIS — Z862 Personal history of diseases of the blood and blood-forming organs and certain disorders involving the immune mechanism: Secondary | ICD-10-CM | POA: Insufficient documentation

## 2014-01-17 DIAGNOSIS — Z9889 Other specified postprocedural states: Secondary | ICD-10-CM | POA: Diagnosis not present

## 2014-01-17 DIAGNOSIS — Z8541 Personal history of malignant neoplasm of cervix uteri: Secondary | ICD-10-CM | POA: Diagnosis not present

## 2014-01-17 DIAGNOSIS — Z79899 Other long term (current) drug therapy: Secondary | ICD-10-CM | POA: Insufficient documentation

## 2014-01-17 DIAGNOSIS — R1110 Vomiting, unspecified: Secondary | ICD-10-CM

## 2014-01-17 LAB — URINALYSIS, ROUTINE W REFLEX MICROSCOPIC
Glucose, UA: NEGATIVE mg/dL
Hgb urine dipstick: NEGATIVE
Ketones, ur: 15 mg/dL — AB
LEUKOCYTES UA: NEGATIVE
NITRITE: NEGATIVE
Protein, ur: 100 mg/dL — AB
SPECIFIC GRAVITY, URINE: 1.027 (ref 1.005–1.030)
Urobilinogen, UA: 1 mg/dL (ref 0.0–1.0)
pH: 6 (ref 5.0–8.0)

## 2014-01-17 LAB — BASIC METABOLIC PANEL
ANION GAP: 16 — AB (ref 5–15)
BUN: 15 mg/dL (ref 6–23)
CHLORIDE: 93 meq/L — AB (ref 96–112)
CO2: 27 meq/L (ref 19–32)
CREATININE: 0.91 mg/dL (ref 0.50–1.10)
Calcium: 9.7 mg/dL (ref 8.4–10.5)
GFR calc Af Amer: >90 mL/min (ref >90)
GFR calc non Af Amer: 81 mL/min — ABNORMAL LOW (ref >90)
Glucose, Bld: 94 mg/dL (ref 70–99)
Potassium: 3.7 mEq/L (ref 3.7–5.3)
Sodium: 136 mEq/L — ABNORMAL LOW (ref 137–147)

## 2014-01-17 LAB — CBC
HCT: 36.7 % (ref 36.0–46.0)
HEMOGLOBIN: 13.1 g/dL (ref 12.0–15.0)
MCH: 27.5 pg (ref 26.0–34.0)
MCHC: 35.7 g/dL (ref 30.0–36.0)
MCV: 77.1 fL — AB (ref 78.0–100.0)
PLATELETS: 184 10*3/uL (ref 150–400)
RBC: 4.76 MIL/uL (ref 3.87–5.11)
RDW: 13.8 % (ref 11.5–15.5)
WBC: 3.7 10*3/uL — ABNORMAL LOW (ref 4.0–10.5)

## 2014-01-17 LAB — URINE MICROSCOPIC-ADD ON

## 2014-01-17 LAB — PREGNANCY, URINE: Preg Test, Ur: NEGATIVE

## 2014-01-17 MED ORDER — SODIUM CHLORIDE 0.9 % IV BOLUS (SEPSIS)
1000.0000 mL | Freq: Once | INTRAVENOUS | Status: AC
  Administered 2014-01-17: 1000 mL via INTRAVENOUS

## 2014-01-17 MED ORDER — ONDANSETRON 4 MG PO TBDP
8.0000 mg | ORAL_TABLET | Freq: Once | ORAL | Status: AC
  Administered 2014-01-17: 8 mg via ORAL
  Filled 2014-01-17: qty 2

## 2014-01-17 MED ORDER — ONDANSETRON 4 MG PO TBDP: ORAL_TABLET | ORAL | Status: DC

## 2014-01-17 NOTE — ED Notes (Signed)
Pt c/o loss of appetite

## 2014-01-17 NOTE — ED Notes (Signed)
Patient transported to X-ray 

## 2014-01-17 NOTE — ED Notes (Signed)
Pt sts she was dx with pneumonia on Sunday morning, has been taking her meds. sts the medicine makes her stay up all night so she is very tired and c/o nausea and decreased appetite. Denies sob. Nad, skin warm and dry, resp e/u.

## 2014-01-17 NOTE — ED Provider Notes (Signed)
CSN: [id number]     Arrival date & time 01/17/14  1758 History   First MD Initiated Contact with Patient 01/17/14 2111     Chief Complaint  Patient presents with  . Nausea  . Pneumonia     (Consider location/radiation/quality/duration/timing/severity/associated sxs/prior Treatment) HPI Peggy Pitts is a 33 year old female with cervical cancer, anemia presents to the ER with nausea vomiting and diarrhea. Patient states her diarrhea and vomiting began last Friday acutely, and has persisted since then. Patient states she was seen in the ER on Sunday 01/13/14 and diagnosed with community-acquired pneumonia, given Levaquin by mouth. The patient states her nausea and vomiting has worsened since Friday, until the past 2 days she has not been able to keep anything down, and she is to the point where she is lightheaded, and "feels like she is going to pass out"when standing up. Patient states her diarrhea has actually improved, and  stopped yesterday. She denies chest pain, shortness of breath, abdominal pain, vaginal pain/discharge/bleeding, syncope, weakness. Patient states she was running a fever over the weekend, after which she was seen in the ER and diagnosed with community-acquired pneumonia. Patient states she has not been febrile since leaving the ER. Patient also states her breathing has improved with treatment on Levaquin. Past Medical History  Diagnosis Date  . Cervical cancer     Squamous cell  . Anemia   . Hx of radiation therapy 04/05/12- 05/17/12, external beam, HDR 1/27, 2/3, 2/10, 2/17, 06/19/12    pelvis periaortic area 45 gray 25 fx, cervix boosted w/HDR x 5 treatmetns at 6 gray per fraction   Past Surgical History  Procedure Laterality Date  . Tubal ligation  2006  . Cervical biopsy  02/28/2012    poorly differentiated squamous cell ca  . Tandem ring insertion  05/22/2012    Procedure: TANDEM RING INSERTION;  Surgeon: Blair Promise, MD;  Location: [facility];   Service: Urology;  Laterality: N/A;  . Tandem ring insertion  05/29/2012    Procedure: TANDEM RING INSERTION;  Surgeon: Blair Promise, MD;  Location: [facility];  Service: Urology;  Laterality: N/A;  . Tandem ring insertion N/A 06/05/2012    Procedure: TANDEM RING INSERTION;  Surgeon: Blair Promise, MD;  Location: [facility];  Service: Urology;  Laterality: N/A;  . Tandem ring insertion N/A 06/12/2012    Procedure: TANDEM RING INSERTION;  Surgeon: Blair Promise, MD;  Location: [facility];  Service: Urology;  Laterality: N/A;  . Tandem ring insertion N/A 06/19/2012    Procedure: TANDEM RING INSERTION;  Surgeon: Blair Promise, MD;  Location: [facility];  Service: Urology;  Laterality: N/A;   Family History  Problem Relation Age of Onset  . Diabetes Mother   . Hypertension Mother    History  Substance Use Topics  . Smoking status: Never Smoker   . Smokeless tobacco: Never Used  . Alcohol Use: Yes     Comment: occ   OB History   Grav Para Term Preterm Abortions TAB SAB Ect Mult Living   3 3 3       3      Review of Systems  Constitutional: Negative for fever.  HENT: Negative for trouble swallowing.   Eyes: Negative for visual disturbance.  Respiratory: Negative for shortness of breath.   Cardiovascular: Negative for chest pain.  Gastrointestinal: Positive for nausea, vomiting and diarrhea. Negative for abdominal pain and blood in stool.  Genitourinary: Negative for dysuria.  Musculoskeletal: Negative for neck pain.  Skin: Negative for rash.  Neurological: Negative for dizziness, weakness and numbness.  Psychiatric/Behavioral: Negative.       Allergies  Shellfish allergy and Adhesive  Home Medications   Prior to Admission medications   Medication Sig Start Date End Date Taking? Authorizing Provider  HYDROcodone-homatropine (HYCODAN) 5-1.5 MG/5ML syrup Take 5 mLs by mouth every 6 (six) hours as needed for cough.    Yes Historical Provider, MD  ibuprofen (ADVIL,MOTRIN) 200 MG tablet Take 200 mg by mouth every 6 (six) hours as needed for mild pain.    Yes Historical Provider, MD  levofloxacin (LEVAQUIN) 500 MG tablet Take 500 mg by mouth daily. x10   Yes Historical Provider, MD  ondansetron (ZOFRAN ODT) 4 MG disintegrating tablet 4mg  ODT q4 hours prn nausea/vomit 01/17/14   Carrie Mew, PA-C   BP 107/69  Pulse 83  Temp(Src) 98.5 F (36.9 C) (Oral)  Resp 17  Ht 5\' 6"  (1.676 m)  Wt 228 lb (103.42 kg)  BMI 36.82 kg/m2  SpO2 100%  LMP 03/07/2012 Physical Exam  Nursing note and vitals reviewed. Constitutional: She is oriented to person, place, and time. She appears well-developed and well-nourished. No distress.  HENT:  Head: Normocephalic and atraumatic.  Mouth/Throat: Oropharynx is clear and moist. No oropharyngeal exudate.  Eyes: EOM are normal. Pupils are equal, round, and reactive to light. Right eye exhibits no discharge. Left eye exhibits no discharge. No scleral icterus.  Neck: Normal range of motion.  Cardiovascular: Normal rate, regular rhythm and normal heart sounds.   No murmur heard. Pulmonary/Chest: Effort normal and breath sounds normal. No respiratory distress.  Abdominal: Soft. There is no tenderness.  Musculoskeletal: Normal range of motion. She exhibits no edema and no tenderness.  Neurological: She is alert and oriented to person, place, and time. She has normal strength. No cranial nerve deficit or sensory deficit. Coordination normal. GCS eye subscore is 4. GCS verbal subscore is 5. GCS motor subscore is 6.  Skin: Skin is warm and dry. No rash noted. She is not diaphoretic.  Psychiatric: She has a normal mood and affect.    ED Course  Procedures (including critical care time) Labs Review Labs Reviewed  CBC - Abnormal; Notable for the following:    WBC 3.7 (*)    MCV 77.1 (*)    All other components within normal limits  BASIC METABOLIC PANEL - Abnormal; Notable for  the following:    Sodium 136 (*)    Chloride 93 (*)    GFR calc non Af Amer 81 (*)    Anion gap 16 (*)    All other components within normal limits  URINALYSIS, ROUTINE W REFLEX MICROSCOPIC - Abnormal; Notable for the following:    Color, Urine AMBER (*)    APPearance CLOUDY (*)    Bilirubin Urine SMALL (*)    Ketones, ur 15 (*)    Protein, ur 100 (*)    All other components within normal limits  URINE MICROSCOPIC-ADD ON - Abnormal; Notable for the following:    Casts HYALINE CASTS (*)    All other components within normal limits  PREGNANCY, URINE    Imaging Review Dg Chest 2 View  01/17/2014   CLINICAL DATA:  Pneumonia 1 week ago.  EXAM: CHEST  2 VIEW  COMPARISON:  January 13, 2014  FINDINGS: The heart size and mediastinal contours are within normal limits. There is no focal infiltrate, pulmonary edema, or  pleural effusion. The visualized skeletal structures are unremarkable.  IMPRESSION: No active cardiopulmonary disease.   Electronically Signed   By: Abelardo Diesel M.D.   On: 01/17/2014 21:37     EKG Interpretation None      MDM   Final diagnoses:  Vomiting and diarrhea    33 year old female with 6 days of nausea, vomiting, diarrhea. 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 Diarrhea stopped yesterday, patient presenting with lightheadedness, signs of mild dehydration. We'll treat symptomatically and rehydrate with IV fluids. With patient's vomiting, diarrhea appearing before her presentation to the ER with community acquired pneumonia, I do not believe this diarrheal illness is do to antibiotic usage.  Patient nontoxic appearing, lying comfortably on a stretcher, in no acute distress. After fluids, Zofran, patient states she's feeling much better, states her lightheadedness also feels better. Patient able to stand up without worsening lightheadedness or feeling of near-syncope. Orthostatic vital signs negative. Labs  otherwise unremarkable. UA remarkable for possible dehydration. We will discharge patient, and have her followup with her primary care physician. I encouraged oral rehydration, and discussed return precautions with patient. I encouraged patient to call or return to ER should her symptoms change, persist, worsen or should she have any questions or concerns.  BP 107/69  Pulse 83  Temp(Src) 98.5 F (36.9 C) (Oral)  Resp 17  Ht 5\' 6"  (1.676 m)  Wt 228 lb (103.42 kg)  BMI 36.82 kg/m2  SpO2 100%  LMP 03/07/2012   Signed,  Dahlia Bailiff, PA-C 12:43 AM  This pt discussed with Dr. Alfonzo Beers, MD    Carrie Mew, PA-C 01/18/14 [phone]

## 2014-01-17 NOTE — Discharge Instructions (Signed)

## 2014-01-17 NOTE — ED Provider Notes (Signed)
Date: 01/17/2014  Rate: 84  Rhythm: normal sinus rhythm  QRS Axis: normal  Intervals: normal  ST/T Wave abnormalities: normal  Conduction Disutrbances: none  Narrative Interpretation: unremarkable     Threasa Beards, MD 01/17/14 2250

## 2014-01-18 NOTE — ED Provider Notes (Signed)
Medical screening examination/treatment/procedure(s) were performed by non-physician practitioner and as supervising physician I was immediately available for consultation/collaboration.   EKG Interpretation None       Threasa Beards, MD 01/18/14 [phone]

## 2014-02-16 ENCOUNTER — Encounter (HOSPITAL_COMMUNITY): Payer: Self-pay | Admitting: Emergency Medicine

## 2014-02-16 ENCOUNTER — Emergency Department (HOSPITAL_COMMUNITY)
Admission: EM | Admit: 2014-02-16 | Discharge: 2014-02-16 | Disposition: A | Discharge status: Home / Self Care | Payer: 59 | Attending: Emergency Medicine | Admitting: Emergency Medicine

## 2014-02-16 DIAGNOSIS — L0291 Cutaneous abscess, unspecified: Secondary | ICD-10-CM

## 2014-02-16 DIAGNOSIS — Z8541 Personal history of malignant neoplasm of cervix uteri: Secondary | ICD-10-CM | POA: Insufficient documentation

## 2014-02-16 DIAGNOSIS — Z862 Personal history of diseases of the blood and blood-forming organs and certain disorders involving the immune mechanism: Secondary | ICD-10-CM | POA: Insufficient documentation

## 2014-02-16 DIAGNOSIS — L02411 Cutaneous abscess of right axilla: Secondary | ICD-10-CM | POA: Diagnosis present

## 2014-02-16 DIAGNOSIS — Z923 Personal history of irradiation: Secondary | ICD-10-CM | POA: Diagnosis not present

## 2014-02-16 MED ORDER — HYDROCODONE-ACETAMINOPHEN 5-325 MG PO TABS: 1.0000 | ORAL_TABLET | ORAL | Status: DC | PRN

## 2014-02-16 MED ORDER — TETANUS-DIPHTH-ACELL PERTUSSIS 5-2.5-18.5 LF-MCG/0.5 IM SUSP: 0.5000 mL | Freq: Once | INTRAMUSCULAR | Status: DC

## 2014-02-16 MED ORDER — LIDOCAINE-EPINEPHRINE (PF) 2 %-1:200000 IJ SOLN
20.0000 mL | Freq: Once | INTRAMUSCULAR | Status: AC
  Administered 2014-02-16: 20 mL
  Filled 2014-02-16: qty 20

## 2014-02-16 NOTE — Discharge Instructions (Signed)

## 2014-02-16 NOTE — ED Notes (Signed)
Pt reports having boil under right arm for several days. No acute distress noted at triage.

## 2014-02-16 NOTE — ED Provider Notes (Signed)
CSN: [id number]     Arrival date & time 02/16/14  1245 History  This chart was scribed for a non-physician practitioner, Brent General, PA-C working with Dorie Rank, MD by Martinique Peace, ED Scribe. The patient was seen in TR11C/TR11C. The patient's care was started at 3:43 PM.    Chief Complaint  Patient presents with  . Abscess      Patient is a 33 y.o. female presenting with abscess. The history is provided by the patient. No language interpreter was used.  Abscess Associated symptoms: no fever, no nausea and no vomiting   HPI Comments: Peggy Pitts is a 33 y.o. female who presents to the Emergency Department complaining of abscess onset a few days ago under right armpit. Pt reports that she thought it was due to her deodorant so tried switching them but denies any improvement.    Past Medical History  Diagnosis Date  . Cervical cancer     Squamous cell  . Anemia   . Hx of radiation therapy 04/05/12- 05/17/12, external beam, HDR 1/27, 2/3, 2/10, 2/17, 06/19/12    pelvis periaortic area 45 gray 25 fx, cervix boosted w/HDR x 5 treatmetns at 6 gray per fraction   Past Surgical History  Procedure Laterality Date  . Tubal ligation  2006  . Cervical biopsy  02/28/2012    poorly differentiated squamous cell ca  . Tandem ring insertion  05/22/2012    Procedure: TANDEM RING INSERTION;  Surgeon: Blair Promise, MD;  Location: [facility];  Service: Urology;  Laterality: N/A;  . Tandem ring insertion  05/29/2012    Procedure: TANDEM RING INSERTION;  Surgeon: Blair Promise, MD;  Location: [facility];  Service: Urology;  Laterality: N/A;  . Tandem ring insertion N/A 06/05/2012    Procedure: TANDEM RING INSERTION;  Surgeon: Blair Promise, MD;  Location: [facility];  Service: Urology;  Laterality: N/A;  . Tandem ring insertion N/A 06/12/2012    Procedure: TANDEM RING INSERTION;  Surgeon: Blair Promise, MD;  Location: [facility];   Service: Urology;  Laterality: N/A;  . Tandem ring insertion N/A 06/19/2012    Procedure: TANDEM RING INSERTION;  Surgeon: Blair Promise, MD;  Location: [facility];  Service: Urology;  Laterality: N/A;   Family History  Problem Relation Age of Onset  . Diabetes Mother   . Hypertension Mother    History  Substance Use Topics  . Smoking status: Never Smoker   . Smokeless tobacco: Never Used  . Alcohol Use: Yes     Comment: occ   OB History   Grav Para Term Preterm Abortions TAB SAB Ect Mult Living   3 3 3       3      Review of Systems  Constitutional: Negative for fever.  Gastrointestinal: Negative for nausea and vomiting.  Skin:       Abscess under right axilla.       Allergies  Shellfish allergy and Adhesive  Home Medications   Prior to Admission medications   Medication Sig Start Date End Date Taking? Authorizing Provider  HYDROcodone-acetaminophen (NORCO/VICODIN) 5-325 MG per tablet Take 1-2 tablets by mouth every 4 (four) hours as needed for moderate pain or severe pain. 02/16/14   Carrie Mew, PA-C   BP 150/84  Pulse 115  Temp(Src) 98 F (36.7 C)  Resp 18  Ht 5\' 6"  (1.676 m)  Wt 228 lb (103.42 kg)  BMI  36.82 kg/m2  SpO2 100%  LMP 03/07/2012 Physical Exam  Nursing note and vitals reviewed. Constitutional: She is oriented to person, place, and time. She appears well-developed and well-nourished. No distress.  HENT:  Head: Normocephalic and atraumatic.  Eyes: Conjunctivae and EOM are normal.  Neck: Neck supple. No tracheal deviation present.  Cardiovascular: Normal rate.   Pulmonary/Chest: Effort normal. No respiratory distress.  Musculoskeletal: Normal range of motion.  Neurological: She is alert and oriented to person, place, and time.  Skin: Skin is warm and dry. No erythema.  Right axilla: 3cm diameter abscess with mild induration and fluctuance in the middle. No surrounding erythema or edema.   Psychiatric: She has a normal mood  and affect. Her behavior is normal.    ED Course  INCISION AND DRAINAGE Date/Time: 02/17/2014 6:34 PM Performed by: Carrie Mew Authorized by: Carrie Mew Consent: Verbal consent obtained. Risks and benefits: risks, benefits and alternatives were discussed Consent given by: patient Patient identity confirmed: verbally with patient Time out: Immediately prior to procedure a "time out" was called to verify the correct patient, procedure, equipment, support staff and site/side marked as required. Type: abscess Body area: trunk Location details: chest Anesthesia: local infiltration Local anesthetic: lidocaine 2% with epinephrine Anesthetic total: 5 ml Patient sedated: no Scalpel size: 11 Incision type: single straight Complexity: simple Drainage: purulent and  serosanguinous Drainage amount: moderate Wound treatment: wound left open Packing material: 1/4 in iodoform gauze Patient tolerance: Patient tolerated the procedure well with no immediate complications. Comments: I&D right axilla.    (including critical care time) Labs Review Labs Reviewed - No data to display  Results for orders placed during the hospital encounter of 01/17/14  CBC      Result Value Ref Range   WBC 3.7 (*) 4.0 - 10.5 K/uL   RBC 4.76  3.87 - 5.11 MIL/uL   Hemoglobin 13.1  12.0 - 15.0 g/dL   HCT 36.7  36.0 - 46.0 %   MCV 77.1 (*) 78.0 - 100.0 fL   MCH 27.5  26.0 - 34.0 pg   MCHC 35.7  30.0 - 36.0 g/dL   RDW 13.8  11.5 - 15.5 %   Platelets 184  150 - 400 K/uL  BASIC METABOLIC PANEL      Result Value Ref Range   Sodium 136 (*) 137 - 147 mEq/L   Potassium 3.7  3.7 - 5.3 mEq/L   Chloride 93 (*) 96 - 112 mEq/L   CO2 27  19 - 32 mEq/L   Glucose, Bld 94  70 - 99 mg/dL   BUN 15  6 - 23 mg/dL   Creatinine, Ser 0.91  0.50 - 1.10 mg/dL   Calcium 9.7  8.4 - 10.5 mg/dL   GFR calc non Af Amer 81 (*) >90 mL/min   GFR calc Af Amer >90  >90 mL/min   Anion gap 16 (*) 5 - 15  URINALYSIS, ROUTINE W  REFLEX MICROSCOPIC      Result Value Ref Range   Color, Urine AMBER (*) YELLOW   APPearance CLOUDY (*) CLEAR   Specific Gravity, Urine 1.027  1.005 - 1.030   pH 6.0  5.0 - 8.0   Glucose, UA NEGATIVE  NEGATIVE mg/dL   Hgb urine dipstick NEGATIVE  NEGATIVE   Bilirubin Urine SMALL (*) NEGATIVE   Ketones, ur 15 (*) NEGATIVE mg/dL   Protein, ur 100 (*) NEGATIVE mg/dL   Urobilinogen, UA 1.0  0.0 - 1.0 mg/dL   Nitrite  NEGATIVE  NEGATIVE   Leukocytes, UA NEGATIVE  NEGATIVE  PREGNANCY, URINE      Result Value Ref Range   Preg Test, Ur NEGATIVE  NEGATIVE  URINE MICROSCOPIC-ADD ON      Result Value Ref Range   Squamous Epithelial / LPF RARE  RARE   WBC, UA 3-6  <3 WBC/hpf   RBC / HPF 0-2  <3 RBC/hpf   Bacteria, UA RARE  RARE   Casts HYALINE CASTS (*) NEGATIVE   Urine-Other MUCOUS PRESENT     Dg Chest 2 View  01/17/2014   CLINICAL DATA:  Pneumonia 1 week ago.  EXAM: CHEST  2 VIEW  COMPARISON:  January 13, 2014  FINDINGS: The heart size and mediastinal contours are within normal limits. There is no focal infiltrate, pulmonary edema, or pleural effusion. The visualized skeletal structures are unremarkable.  IMPRESSION: No active cardiopulmonary disease.   Electronically Signed   By: Abelardo Diesel M.D.   On: 01/17/2014 21:37      Imaging Review No results found.   EKG Interpretation None     Medications  lidocaine-EPINEPHrine (XYLOCAINE W/EPI) 2 %-1:200000 (PF) injection 20 mL (20 mLs Infiltration Given by Other 02/16/14 1700)    3:45 PM- Treatment plan was discussed with patient who verbalizes understanding and agrees.   MDM   Final diagnoses:  Abscess   Patient with skin abscess amenable to incision and drainage.  Abscess large enough to warrant packing,  wound recheck in 2 days. Encouraged home warm soaks and flushing.  Mild signs of cellulitis is surrounding skin.  Will d/c to home.  No antibiotic therapy is indicated.  I personally performed the services described in this  documentation, which was scribed in my presence. The recorded information has been reviewed and is accurate.  BP 127/88  Pulse 77  Temp(Src) 99.1 F (37.3 C) (Oral)  Resp 18  Ht 5\' 6"  (1.676 m)  Wt 228 lb (103.42 kg)  BMI 36.82 kg/m2  SpO2 100%  LMP 03/07/2012   Carrie Mew, PA-C 02/17/14 [place], PA-C 02/17/14 [phone]

## 2014-02-19 NOTE — ED Provider Notes (Signed)
Medical screening examination/treatment/procedure(s) were performed by non-physician practitioner and as supervising physician I was immediately available for consultation/collaboration.    Dorie Rank, MD 02/19/14 [phone]

## 2014-02-25 ENCOUNTER — Encounter (HOSPITAL_COMMUNITY): Payer: Self-pay | Admitting: Emergency Medicine

## 2014-03-01 ENCOUNTER — Encounter (HOSPITAL_COMMUNITY): Payer: Self-pay | Admitting: Emergency Medicine

## 2014-03-01 ENCOUNTER — Emergency Department (HOSPITAL_COMMUNITY)
Admission: EM | Admit: 2014-03-01 | Discharge: 2014-03-01 | Disposition: A | Discharge status: Home / Self Care | Payer: 59 | Attending: Emergency Medicine | Admitting: Emergency Medicine

## 2014-03-01 DIAGNOSIS — L089 Local infection of the skin and subcutaneous tissue, unspecified: Secondary | ICD-10-CM | POA: Diagnosis present

## 2014-03-01 DIAGNOSIS — Z792 Long term (current) use of antibiotics: Secondary | ICD-10-CM | POA: Diagnosis not present

## 2014-03-01 DIAGNOSIS — L732 Hidradenitis suppurativa: Secondary | ICD-10-CM | POA: Diagnosis not present

## 2014-03-01 DIAGNOSIS — Z8541 Personal history of malignant neoplasm of cervix uteri: Secondary | ICD-10-CM | POA: Insufficient documentation

## 2014-03-01 DIAGNOSIS — Z923 Personal history of irradiation: Secondary | ICD-10-CM | POA: Diagnosis not present

## 2014-03-01 DIAGNOSIS — Z862 Personal history of diseases of the blood and blood-forming organs and certain disorders involving the immune mechanism: Secondary | ICD-10-CM | POA: Insufficient documentation

## 2014-03-01 MED ORDER — HYDROCODONE-ACETAMINOPHEN 5-325 MG PO TABS: 1.0000 | ORAL_TABLET | Freq: Four times a day (QID) | ORAL | Status: DC | PRN

## 2014-03-01 MED ORDER — SULFAMETHOXAZOLE-TRIMETHOPRIM 800-160 MG PO TABS
2.0000 | ORAL_TABLET | Freq: Two times a day (BID) | ORAL | Status: DC
Start: 2014-03-01 — End: 2014-05-23

## 2014-03-01 NOTE — ED Notes (Signed)
Was seen 1 week ago for same boil under  Rt arm had it drained states not given any antibiotics and today she feels like the knot is back and she has a 2nd one  that has formed

## 2014-03-01 NOTE — Discharge Instructions (Signed)
Hidradenitis Suppurativa, Sweat Gland Abscess Hidradenitis suppurativa is a long lasting (chronic), uncommon disease of the sweat glands. With this, boil-like lumps and scarring develop in the groin, some times under the arms (axillae), and under the breasts. It may also uncommonly occur behind the ears, in the crease of the buttocks, and around the genitals.  CAUSES  The cause is from a blocking of the sweat glands. They then become infected. It may cause drainage and odor. It is not contagious. So it cannot be given to someone else. It most often shows up in puberty (about 10 to 34 years of age). But it may happen much later. It is similar to acne which is a disease of the sweat glands. This condition is slightly more common in African-Americans and women. SYMPTOMS   Hidradenitis usually starts as one or more red, tender, swellings in the groin or under the arms (axilla).  Over a period of hours to days the lesions get larger. They often open to the skin surface, draining clear to yellow-colored fluid.  The infected area heals with scarring. DIAGNOSIS  Your caregiver makes this diagnosis by looking at you. Sometimes cultures (growing germs on plates in the lab) may be taken. This is to see what germ (bacterium) is causing the infection.  TREATMENT   Topical germ killing medicine applied to the skin (antibiotics) are the treatment of choice. Antibiotics taken by mouth (systemic) are sometimes needed when the condition is getting worse or is severe.  Avoid tight-fitting clothing which traps moisture in.  Dirt does not cause hidradenitis and it is not caused by poor hygiene.  Involved areas should be cleaned daily using an antibacterial soap. Some patients find that the liquid form of Lever 2000, applied to the involved areas as a lotion after bathing, can help reduce the odor related to this condition.  Sometimes surgery is needed to drain infected areas or remove scarred tissue. Removal of  large amounts of tissue is used only in severe cases.  Birth control pills may be helpful.  Oral retinoids (vitamin A derivatives) for 6 to 12 months which are effective for acne may also help this condition.  Weight loss will improve but not cure hidradenitis. It is made worse by being overweight. But the condition is not caused by being overweight.  This condition is more common in people who have had acne.  It may become worse under stress. There is no medical cure for hidradenitis. It can be controlled, but not cured. The condition usually continues for years with periods of getting worse and getting better (remission). Document Released: 11/25/2003 Document Revised: 07/05/2011 Document Reviewed: 07/13/2013 [employer] Patient Information 2015 [employer]. This information is not intended to replace advice given to you by your health care provider. Make sure you discuss any questions you have with your health care provider.  

## 2014-03-01 NOTE — ED Provider Notes (Signed)
CSN: [id number]     Arrival date & time 03/01/14  2841 History   First MD Initiated Contact with Patient 03/01/14 0827     Chief Complaint  Patient presents with  . Wound Infection     (Consider location/radiation/quality/duration/timing/severity/associated sxs/prior Treatment) HPI Comments: Patient presents today with a chief complaint of worsening abscess of the right axilla.  She reports that the abscess has been present for the past couple of weeks.  She states that initially there was only one abscess, but several other small abscesses have developed in that same area.  Area is tender.  She denies any drainage from the area.  She was seen in the ED on 02/16/14 and had the area incised and drained at that time.  She has not been on any antibiotics.  She denies fever, chills, nausea, or vomiting.  She denies history of HIV or DM.    The history is provided by the patient.    Past Medical History  Diagnosis Date  . Cervical cancer     Squamous cell  . Anemia   . Hx of radiation therapy 04/05/12- 05/17/12, external beam, HDR 1/27, 2/3, 2/10, 2/17, 06/19/12    pelvis periaortic area 45 gray 25 fx, cervix boosted w/HDR x 5 treatmetns at 6 gray per fraction   Past Surgical History  Procedure Laterality Date  . Tubal ligation  2006  . Cervical biopsy  02/28/2012    poorly differentiated squamous cell ca  . Tandem ring insertion  05/22/2012    Procedure: TANDEM RING INSERTION;  Surgeon: Blair Promise, MD;  Location: [facility];  Service: Urology;  Laterality: N/A;  . Tandem ring insertion  05/29/2012    Procedure: TANDEM RING INSERTION;  Surgeon: Blair Promise, MD;  Location: [facility];  Service: Urology;  Laterality: N/A;  . Tandem ring insertion N/A 06/05/2012    Procedure: TANDEM RING INSERTION;  Surgeon: Blair Promise, MD;  Location: [facility];  Service: Urology;  Laterality: N/A;  . Tandem ring insertion N/A 06/12/2012    Procedure:  TANDEM RING INSERTION;  Surgeon: Blair Promise, MD;  Location: [facility];  Service: Urology;  Laterality: N/A;  . Tandem ring insertion N/A 06/19/2012    Procedure: TANDEM RING INSERTION;  Surgeon: Blair Promise, MD;  Location: [facility];  Service: Urology;  Laterality: N/A;   Family History  Problem Relation Age of Onset  . Diabetes Mother   . Hypertension Mother    History  Substance Use Topics  . Smoking status: Never Smoker   . Smokeless tobacco: Never Used  . Alcohol Use: Yes     Comment: occ   OB History    Gravida Para Term Preterm AB TAB SAB Ectopic Multiple Living   3 3 3       3      Review of Systems  All other systems reviewed and are negative.     Allergies  Shellfish allergy and Adhesive  Home Medications   Prior to Admission medications   Medication Sig Start Date End Date Taking? Authorizing Provider  HYDROcodone-homatropine (HYCODAN) 5-1.5 MG/5ML syrup Take 5 mLs by mouth every 6 (six) hours as needed for cough.   Yes Historical Provider, MD  neomycin-bacitracin-polymyxin (NEOSPORIN) ointment Apply 1 application topically every 12 (twelve) hours.   Yes Historical Provider, MD  HYDROcodone-acetaminophen (NORCO/VICODIN) 5-325 MG per tablet Take 1-2 tablets by mouth every 4 (four) hours as needed for  moderate pain or severe pain. 02/16/14   Carrie Mew, PA-C   BP 105/64 mmHg  Pulse 96  Temp(Src) 98.6 F (37 C) (Oral)  Resp 18  SpO2 99%  LMP 03/07/2012 Physical Exam  Constitutional: She appears well-developed and well-nourished.  HENT:  Head: Normocephalic and atraumatic.  Mouth/Throat: Oropharynx is clear and moist.  Neck: Normal range of motion. Neck supple.  Cardiovascular: Normal rate, regular rhythm and normal heart sounds.   Pulmonary/Chest: Effort normal and breath sounds normal.  Musculoskeletal: Normal range of motion.  Neurological: She is alert.  Skin: Skin is warm and dry.  Tender, firm, nodules in  the right axilla.  Mild surrounding erythema.  No streaking.  No active drainage.  No fluctuance.    Psychiatric: She has a normal mood and affect.  Nursing note and vitals reviewed.   ED Course  Procedures (including critical care time) Labs Review Labs Reviewed - No data to display  Imaging Review No results found.   EKG Interpretation None      MDM   Final diagnoses:  None   Patient presents today with a chief complaint of a right axilla abscess.  She was seen in the ED on 02/16/14 and had an abscess of that area incised and drained.  She was not started on antibiotics at that time.  Today her exam is most consistent with Hidradenitis Suppurativa.  Patient started on Bactrim DS and given referral to General Surgery.  No discrete abscess to drain at this time.  Patient is afebrile.  No nausea or vomiting.  Feel that the patient is stable for discharge.  Return precautions given.    Hyman Bible, PA-C 03/01/14 [place], MD 03/01/14 1640

## 2014-05-23 ENCOUNTER — Ambulatory Visit
Admission: RE | Admit: 2014-05-23 | Discharge: 2014-05-23 | Disposition: A | Discharge status: Home / Self Care | Payer: Self-pay | Source: Ambulatory Visit | Attending: Radiation Oncology | Admitting: Radiation Oncology

## 2014-05-23 ENCOUNTER — Other Ambulatory Visit (HOSPITAL_COMMUNITY)
Admission: RE | Admit: 2014-05-23 | Discharge: 2014-05-23 | Disposition: A | Discharge status: Home / Self Care | Payer: 59 | Source: Ambulatory Visit | Attending: Radiation Oncology | Admitting: Radiation Oncology

## 2014-05-23 ENCOUNTER — Encounter: Payer: Self-pay | Admitting: Radiation Oncology

## 2014-05-23 ENCOUNTER — Ambulatory Visit: Payer: Self-pay | Admitting: Gynecologic Oncology

## 2014-05-23 VITALS — BP 124/96 | HR 85 | Temp 98.1°F | Resp 20 | Ht 66.0 in | Wt 251.6 lb

## 2014-05-23 DIAGNOSIS — N760 Acute vaginitis: Secondary | ICD-10-CM | POA: Diagnosis present

## 2014-05-23 DIAGNOSIS — C539 Malignant neoplasm of cervix uteri, unspecified: Secondary | ICD-10-CM

## 2014-05-23 DIAGNOSIS — Z01419 Encounter for gynecological examination (general) (routine) without abnormal findings: Secondary | ICD-10-CM | POA: Diagnosis present

## 2014-05-23 DIAGNOSIS — Z113 Encounter for screening for infections with a predominantly sexual mode of transmission: Secondary | ICD-10-CM | POA: Insufficient documentation

## 2014-05-23 NOTE — Progress Notes (Signed)
  Radiation Oncology         [phone] ________________________________  Name: Peggy Pitts MRN: [id number]  Date: 05/23/2014  DOB: [birth year]  Follow-Up Visit Note  CC: No PCP Per Patient  No ref. provider found    ICD-9-CM ICD-10-CM   1. 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  She has returned from living in [place]. Patient admits to being separated  from her husband but continues to have sexual relations. She is concerned she may have a sexually transmitted disease and is inquiring about testing for this. Patient has noticed vaginal discharge but no bleeding except associated with intercourse. She denies any hematuria or rectal bleeding. She denies any problems with her bowels, pelvic, low back, pain or flank pain.    The patient did undergo a CT scan of abdomen and pelvis with her last follow-up in gynecologic oncology. This showed no evidence of recurrence.                          ALLERGIES:  is allergic to shellfish allergy and adhesive.  Meds: No current outpatient prescriptions on file.   No current facility-administered medications for this encounter.   Facility-Administered Medications Ordered in Other Encounters  Medication Dose Route Frequency Provider Last Rate Last Dose  . 0.9 %  sodium chloride infusion  250 mL Intravenous Once Lennis Marion Downer, MD      . acetaminophen (TYLENOL) tablet 650 mg  650 mg Oral Once Lennis Marion Downer, MD        Physical Findings: The patient is in no acute distress. Patient is alert and oriented.  height is 5\' 6"  (1.676 m) and weight is 251 lb 9.6 oz (114.125 kg). Her oral temperature is 98.1 F (36.7 C). Her blood pressure is 124/96 and her pulse is 85. Her respiration is 20. Marland Kitchen  No palpable subclavicular or axillary adenopathy. The lungs are clear to auscultation.  The heart has regular rhythm and rate. The abdomen is soft and nontender with normal bowel sounds. No inguinal adenopathy appreciated. On pelvic examination the external genitalia are unremarkable. Speculum exam is performed. There is some vaginal discharge clear to yellowish. A Pap smear was obtained of the cervical region. The cervix bleeds easily with Pap smear but minimally. Radiation changes are noted in the cervical and upper vaginal region. On bimanual and rectovaginal examination the cervix appears normal in size although exam is somewhat difficult in light of the patient's body habitus.  Lab Findings: Lab Results  Component Value Date   WBC 3.7* 01/17/2014   HGB 13.1 01/17/2014   HCT 36.7 01/17/2014   MCV 77.1* 01/17/2014   PLT 184 01/17/2014    Radiographic Findings: No results found.  Impression:  No evidence of recurrence on clinical exam today, Pap smear pending, STD testing ordered on pap smear tissue.  Plan:  Routine follow-up in 6 months. In the interim the patient will be seen by gynecologic oncology.  ____________________________________ Blair Promise, MD

## 2014-05-23 NOTE — Progress Notes (Addendum)
Peggy Pitts here for follow up after treatment for cervical cancer.  She denies pain.  She denies bladder issues, bowel issues, nausea and fatigue.  She reports occasional bleeding after intercourse.  She says that the sheet will have a moderate amount of blood on it.  BP 124/96 mmHg  Pulse 85  Temp(Src) 98.1 F (36.7 C) (Oral)  Resp 20  Ht 5\' 6"  (1.676 m)  Wt 251 lb 9.6 oz (114.125 kg)  BMI 40.63 kg/m2

## 2014-05-27 LAB — CERVICOVAGINAL ANCILLARY ONLY
Bacterial vaginitis: POSITIVE — AB
Candida vaginitis: NEGATIVE
Herpes: NEGATIVE

## 2014-05-27 LAB — CYTOLOGY - PAP

## 2014-05-28 ENCOUNTER — Other Ambulatory Visit: Payer: Self-pay | Admitting: Radiation Oncology

## 2014-05-28 MED ORDER — METRONIDAZOLE 500 MG PO TABS: 500.0000 mg | ORAL_TABLET | Freq: Two times a day (BID) | ORAL | Status: DC

## 2014-05-28 NOTE — Addendum Note (Signed)
Encounter addended by: Jacqulyn Liner, RN on: 05/28/2014 11:28 AM<BR>     Documentation filed: Demographics Visit

## 2014-05-29 ENCOUNTER — Telehealth: Payer: Self-pay | Admitting: Oncology

## 2014-05-29 NOTE — Telephone Encounter (Signed)
Called Peggy Pitts and let her know the good results on her pap smear per Dr. Sondra Come.  Peggy Pitts verbalized agreement and also said that she had picked up the medication that Dr. Sondra Come had sent in for her.

## 2014-08-01 ENCOUNTER — Ambulatory Visit: Payer: Self-pay | Admitting: Gynecologic Oncology

## 2014-08-22 ENCOUNTER — Ambulatory Visit: Payer: 59 | Attending: Gynecologic Oncology | Admitting: Gynecologic Oncology

## 2014-09-06 ENCOUNTER — Emergency Department (HOSPITAL_COMMUNITY): Payer: 59

## 2014-09-06 ENCOUNTER — Encounter (HOSPITAL_COMMUNITY): Payer: Self-pay | Admitting: Physical Medicine and Rehabilitation

## 2014-09-06 ENCOUNTER — Emergency Department (HOSPITAL_COMMUNITY)
Admission: EM | Admit: 2014-09-06 | Discharge: 2014-09-06 | Disposition: A | Discharge status: Home / Self Care | Payer: 59 | Attending: Emergency Medicine | Admitting: Emergency Medicine

## 2014-09-06 DIAGNOSIS — Y9389 Activity, other specified: Secondary | ICD-10-CM | POA: Insufficient documentation

## 2014-09-06 DIAGNOSIS — Z8541 Personal history of malignant neoplasm of cervix uteri: Secondary | ICD-10-CM | POA: Diagnosis not present

## 2014-09-06 DIAGNOSIS — Z923 Personal history of irradiation: Secondary | ICD-10-CM | POA: Diagnosis not present

## 2014-09-06 DIAGNOSIS — S4991XA Unspecified injury of right shoulder and upper arm, initial encounter: Secondary | ICD-10-CM | POA: Insufficient documentation

## 2014-09-06 DIAGNOSIS — S5001XA Contusion of right elbow, initial encounter: Secondary | ICD-10-CM | POA: Insufficient documentation

## 2014-09-06 DIAGNOSIS — Y998 Other external cause status: Secondary | ICD-10-CM | POA: Insufficient documentation

## 2014-09-06 DIAGNOSIS — S8001XA Contusion of right knee, initial encounter: Secondary | ICD-10-CM | POA: Diagnosis not present

## 2014-09-06 DIAGNOSIS — Z862 Personal history of diseases of the blood and blood-forming organs and certain disorders involving the immune mechanism: Secondary | ICD-10-CM | POA: Insufficient documentation

## 2014-09-06 DIAGNOSIS — S199XXA Unspecified injury of neck, initial encounter: Secondary | ICD-10-CM | POA: Diagnosis not present

## 2014-09-06 DIAGNOSIS — Z792 Long term (current) use of antibiotics: Secondary | ICD-10-CM | POA: Insufficient documentation

## 2014-09-06 DIAGNOSIS — Y92410 Unspecified street and highway as the place of occurrence of the external cause: Secondary | ICD-10-CM | POA: Diagnosis not present

## 2014-09-06 DIAGNOSIS — S8991XA Unspecified injury of right lower leg, initial encounter: Secondary | ICD-10-CM | POA: Diagnosis present

## 2014-09-06 DIAGNOSIS — S59911A Unspecified injury of right forearm, initial encounter: Secondary | ICD-10-CM | POA: Diagnosis not present

## 2014-09-06 MED ORDER — IBUPROFEN 400 MG PO TABS
600.0000 mg | ORAL_TABLET | Freq: Once | ORAL | Status: AC
  Administered 2014-09-06: 600 mg via ORAL
  Filled 2014-09-06 (×2): qty 1
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
## 2014-09-12 ENCOUNTER — Telehealth: Payer: Self-pay | Admitting: *Deleted

## 2014-09-12 NOTE — Telephone Encounter (Signed)
Pt was a No show for scheduled appt on 08/22/2014 with Dr. Alycia Rossetti.  A Message was lleft asking patient to call GYN Oncology at [phone] to reschedule her missed follow up appointment with Dr. Alycia Rossetti.

## 2015-07-24 ENCOUNTER — Telehealth: Payer: Self-pay | Admitting: Radiation Oncology

## 2015-07-24 NOTE — Telephone Encounter (Signed)
Phone just rang, no voicemail, needed to move patients appointment from the 6th of April to a different time, will attempt again at later date

## 2015-07-31 ENCOUNTER — Ambulatory Visit: Admission: RE | Admit: 2015-07-31 | Payer: 59 | Source: Ambulatory Visit | Admitting: Radiation Oncology

## 2015-07-31 ENCOUNTER — Ambulatory Visit
Admission: RE | Admit: 2015-07-31 | Discharge: 2015-07-31 | Disposition: A | Discharge status: Home / Self Care | Payer: 59 | Source: Ambulatory Visit | Attending: Radiation Oncology | Admitting: Radiation Oncology

## 2015-10-09 ENCOUNTER — Other Ambulatory Visit: Payer: Self-pay | Admitting: Oncology

## 2015-10-09 ENCOUNTER — Telehealth: Payer: Self-pay | Admitting: Oncology

## 2015-10-09 ENCOUNTER — Ambulatory Visit: Payer: 59

## 2015-10-09 ENCOUNTER — Ambulatory Visit: Payer: 59 | Admitting: Radiation Oncology

## 2015-10-09 ENCOUNTER — Ambulatory Visit
Admission: RE | Admit: 2015-10-09 | Discharge: 2015-10-09 | Disposition: A | Discharge status: Home / Self Care | Payer: 59 | Source: Ambulatory Visit | Attending: Radiation Oncology | Admitting: Radiation Oncology

## 2015-10-09 DIAGNOSIS — C539 Malignant neoplasm of cervix uteri, unspecified: Secondary | ICD-10-CM

## 2015-10-09 NOTE — Telephone Encounter (Signed)
Peggy Pitts called and said she that she has been having bleeding that started a week ago.  She said she is not sure if it is vaginal bleeding.  She said she will having lower abdominal pain and then will see a small amount of bleeding.  She also reports seeing blood sometimes when she urinates.  Advised her that we will call her back with an appointment time.

## 2015-10-09 NOTE — Telephone Encounter (Signed)
Called [name] back and arranged for her to come in for labs at 2:15 and then to see Dr. Sondra Come at 3:00.

## 2015-10-13 ENCOUNTER — Telehealth: Payer: Self-pay | Admitting: Oncology

## 2015-10-13 ENCOUNTER — Ambulatory Visit: Payer: 59 | Admitting: Radiation Oncology

## 2015-10-13 ENCOUNTER — Ambulatory Visit: Admission: RE | Admit: 2015-10-13 | Payer: 59 | Source: Ambulatory Visit

## 2015-10-13 ENCOUNTER — Inpatient Hospital Stay: Admission: RE | Admit: 2015-10-13 | Payer: 59 | Source: Ambulatory Visit

## 2015-10-13 ENCOUNTER — Ambulatory Visit: Payer: 59 | Attending: Radiation Oncology

## 2015-10-13 NOTE — Telephone Encounter (Signed)
Peggy Pitts regarding her appointment today with Dr. Sondra Come.  Peggy Pitts said she was not notified of the appointment time and would like to reschedule.  Made an appointment for lab at 10 am and follow up with Dr. Sondra Come at 10:30 on Wednesday, 10/15/15.  Peggy Pitts also asked how to get set up with a "green card" for insurance.  Called and left a message with Jodelle Green, Estate manager/land agent.

## 2015-10-15 ENCOUNTER — Ambulatory Visit
Admission: RE | Admit: 2015-10-15 | Discharge: 2015-10-15 | Disposition: A | Discharge status: Home / Self Care | Payer: 59 | Source: Ambulatory Visit | Attending: Radiation Oncology | Admitting: Radiation Oncology

## 2015-10-15 ENCOUNTER — Ambulatory Visit: Admission: RE | Admit: 2015-10-15 | Payer: 59 | Source: Ambulatory Visit

## 2015-10-15 ENCOUNTER — Other Ambulatory Visit (HOSPITAL_COMMUNITY)
Admission: RE | Admit: 2015-10-15 | Discharge: 2015-10-15 | Disposition: A | Discharge status: Home / Self Care | Payer: 59 | Source: Ambulatory Visit | Attending: Radiation Oncology | Admitting: Radiation Oncology

## 2015-10-15 ENCOUNTER — Other Ambulatory Visit: Payer: Self-pay | Admitting: Oncology

## 2015-10-15 ENCOUNTER — Encounter: Payer: Self-pay | Admitting: Radiation Oncology

## 2015-10-15 ENCOUNTER — Telehealth: Payer: Self-pay | Admitting: Oncology

## 2015-10-15 ENCOUNTER — Ambulatory Visit: Payer: 59

## 2015-10-15 VITALS — BP 119/75 | HR 100 | Temp 98.5°F | Ht 66.0 in | Wt 272.6 lb

## 2015-10-15 DIAGNOSIS — Z01411 Encounter for gynecological examination (general) (routine) with abnormal findings: Secondary | ICD-10-CM | POA: Insufficient documentation

## 2015-10-15 DIAGNOSIS — C539 Malignant neoplasm of cervix uteri, unspecified: Secondary | ICD-10-CM

## 2015-10-15 LAB — BASIC METABOLIC PANEL
ANION GAP: 8 meq/L (ref 3–11)
BUN: 10.3 mg/dL (ref 7.0–26.0)
CALCIUM: 9.5 mg/dL (ref 8.4–10.4)
CO2: 28 mEq/L (ref 22–29)
CREATININE: 1 mg/dL (ref 0.6–1.1)
Chloride: 104 mEq/L (ref 98–109)
EGFR: 83 mL/min/{1.73_m2} — AB (ref >90)
GLUCOSE: 141 mg/dL — AB (ref 70–140)
Potassium: 4 mEq/L (ref 3.5–5.1)
SODIUM: 140 meq/L (ref 136–145)

## 2015-10-15 LAB — URINALYSIS, MICROSCOPIC - CHCC
Bilirubin (Urine): NEGATIVE
Glucose: NEGATIVE mg/dL
KETONES: NEGATIVE mg/dL
Nitrite: NEGATIVE
Protein: <30 mg/dL
SPECIFIC GRAVITY, URINE: 1.02 (ref 1.003–1.035)
UROBILINOGEN UR: 0.2 mg/dL (ref 0.2–1)
pH: 5 (ref 4.6–8.0)

## 2015-10-15 LAB — CBC WITH DIFFERENTIAL/PLATELET
BASO%: 0.6 % (ref 0.0–2.0)
BASOS ABS: 0 10*3/uL (ref 0.0–0.1)
EOS%: 4.6 % (ref 0.0–7.0)
Eosinophils Absolute: 0.3 10*3/uL (ref 0.0–0.5)
HEMATOCRIT: 35.7 % (ref 34.8–46.6)
HGB: 11.6 g/dL (ref 11.6–15.9)
LYMPH%: 25.2 % (ref 14.0–49.7)
MCH: 26.5 pg (ref 25.1–34.0)
MCHC: 32.6 g/dL (ref 31.5–36.0)
MCV: 81.4 fL (ref 79.5–101.0)
MONO#: 0.4 10*3/uL (ref 0.1–0.9)
MONO%: 5.8 % (ref 0.0–14.0)
NEUT#: 4.3 10*3/uL (ref 1.5–6.5)
NEUT%: 63.8 % (ref 38.4–76.8)
Platelets: 216 10*3/uL (ref 145–400)
RBC: 4.38 10*6/uL (ref 3.70–5.45)
RDW: 15.3 % — ABNORMAL HIGH (ref 11.2–14.5)
WBC: 6.8 10*3/uL (ref 3.9–10.3)
lymph#: 1.7 10*3/uL (ref 0.9–3.3)

## 2015-10-15 NOTE — Telephone Encounter (Signed)
Called Mima and advised her of the appointment with Dr. Alycia Rossetti on 04/14/16 at 12:00.  Flower verbalized agreement.

## 2015-10-15 NOTE — Telephone Encounter (Signed)
Peggy Pitts called and said that her husband would like to go to her appointment with her and they need an afternoon appointment time.  Gave her the phone number for [facility] and she will call to schedule the appointment.

## 2015-10-15 NOTE — Progress Notes (Signed)
Peggy Pitts here for follow up.  She denies having pain today.  She reports having spotting "on and off" that started two weeks ago.  She said she could be sitting down and have cramping in her lower abdomen and then will notice spotting after she urinates.  She also mentioned having spotting for a few hours after intercourse or using a dilator.  She denies having dysuria or bowel issues.  She reports having a good energy level and appetite.  BP 119/75 mmHg  Pulse 100  Temp(Src) 98.5 F (36.9 C) (Oral)  Ht 5\' 6"  (1.676 m)  Wt 272 lb 9.6 oz (123.651 kg)  BMI 44.02 kg/m2  SpO2 100%

## 2015-10-15 NOTE — Progress Notes (Signed)
Radiation Oncology         [phone] ________________________________  Name: Peggy Pitts MRN: [id number]  Date: 10/15/2015  DOB: Oct 10, 1980  Follow-Up Visit Note  CC: No PCP Per Patient  Default, Provider, MD    ICD-9-CM ICD-10-CM   1. 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 She denies having pain today. She reports having spotting "on and off" that started two weeks ago. She said she could be sitting down and would have cramping in her lower abdomen and then would notice spotting after she urinates. She also mentioned spotting for a few hours after intercourse or using a dilator. She reports not using the dilator that much. She denies dysuria, bowel issues, rectal bleeding, or nausea. She reports having a good energy level and appetite. The patient's CBC collected this morning is normal and the urinalysis was positive for trace bacteria and a few epithelial cells. A urine culture was also collected and the results are pending.   ALLERGIES:  is allergic to shellfish allergy and adhesive.  Meds: No current outpatient prescriptions on file.   No current facility-administered medications for this encounter.   Facility-Administered Medications Ordered in Other Encounters  Medication Dose Route Frequency Provider Last Rate Last Dose  . 0.9 %  sodium  chloride infusion  250 mL Intravenous Once Lennis Marion Downer, MD      . acetaminophen (TYLENOL) tablet 650 mg  650 mg Oral Once Lennis Marion Downer, MD        Physical Findings: The patient is in no acute distress. Patient is alert and oriented.  height is 5\' 6"  (1.676 m) and weight is 272 lb 9.6 oz (123.651 kg). Her oral temperature is 98.5 F (36.9 C). Her blood pressure is 119/75 and her pulse is 100. Her oxygen saturation is 100%.   No palpable subclavicular or axillary adenopathy. The lungs are clear to auscultation. The heart has regular rhythm and rate. The abdomen is soft and nontender with normal bowel sounds. No inguinal adenopathy appreciated. On pelvic examination the external genitalia are unremarkable. Speculum exam is performed. No mucosal lesions noted in the vaginal vault. Radiation changes are noted in the proximal vagina. Unable to identify the cervical os. On bimanual and rectovaginal examination no pelvic masses were appreciated. A Pap smear was obtained of the proximal vagina.   Lab Findings: Lab Results  Component Value Date   WBC 6.8 10/15/2015   HGB 11.6 10/15/2015   HCT 35.7 10/15/2015   MCV 81.4 10/15/2015   PLT 216 10/15/2015    Radiographic Findings: No results found.  Impression:  No evidence of recurrence on clinical exam today. Pap smear pending. I am unsure why the patient has spotting, but it may be related to  radiation changes noted in the vaginal mucosa today.  Plan:  Routine follow-up with Dr. Alycia Rossetti in 6 months and radiation oncology in 1 year since she is out over  3 years since her treatment. Patient will call back if she continues to have bleeding especially if more significant.  ____________________________________ -----------------------------------  Blair Promise, PhD, MD  This document serves as a record of services personally performed by Gery Pray, MD. It was created on his behalf by Darcus Austin, a trained medical scribe. The creation  of this record is based on the scribe's personal observations and the provider's statements to them. This document has been checked and approved by the attending provider.

## 2015-10-16 LAB — CYTOLOGY - PAP

## 2015-10-16 LAB — URINE CULTURE

## 2015-10-17 ENCOUNTER — Telehealth: Payer: Self-pay | Admitting: Oncology

## 2015-10-17 NOTE — Telephone Encounter (Signed)
Left a message for Peggy Pitts regarding her good pap smear results per Dr. Sondra Come.  Requested a return call.

## 2015-10-20 NOTE — Telephone Encounter (Signed)
Peggy Pitts called back.  Informed her of the good results per Dr. Sondra Come on her pap smear.  Peggy Pitts verbalized agreement and understanding.

## 2016-04-07 NOTE — Telephone Encounter (Signed)
error 

## 2016-04-14 ENCOUNTER — Ambulatory Visit: Payer: 59 | Attending: Gynecologic Oncology | Admitting: Gynecologic Oncology

## 2016-04-14 ENCOUNTER — Other Ambulatory Visit: Payer: Self-pay | Admitting: Gynecologic Oncology

## 2016-04-14 DIAGNOSIS — C531 Malignant neoplasm of exocervix: Secondary | ICD-10-CM

## 2016-04-14 NOTE — Progress Notes (Deleted)
Consult Note: Gyn-Onc  Peggy Pitts 35 y.o. female  CC: No chief complaint on file.   HPI: Patient is a 35 year old gravida 3 para 3 who had not had a Pap smear approximately 7 years. At the end of September 2013 she began having some vaginal discharge and went to the emergency room where she was told should bacterial vaginosis. She was treated with antibiotics for 14 days. While the odor diminished, the discharge continued. The odor came back and she was again seen in the emergency room on November 4. 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 In addition, she also had transvaginal ultrasound revealed an irregular heterogeneous mass present at the level of the cervix and within the vagina measuring 6.7 x 4.8 x 5.1 cm in size. 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 She had a CT scan performed that revealed the following:  CT CHEST  Findings: Ill-defined anterior mediastinal tissue likely represents residual thymic tissue given its indistinct margination.  Small axillary lymph nodes do not appear pathologically enlarged. No pathologic mediastinal adenopathy noted. No significant pulmonary nodules.  IMPRESSION:  1. No findings of malignancy in the thorax.   CT ABDOMEN AND PELVIS  Findings: The liver, spleen, pancreas, and adrenal glands appear unremarkable. The gallbladder and biliary system appear unremarkable.  Small scattered retroperitoneal lymph nodes are not pathologically enlarged by size criteria. Several pericecal lymph nodes are also  within normal limits. Scattered small mesenteric lymph nodes are within normal limits. Left upper internal iliac lymph node measures 1.7 cm in short axis, slight surrounding stranding. A similar right sided internal iliac node measures 1.4 cm in short axis on. Multiple additional pathologically enlarged pelvic lymph nodes include a 1.8 cm short axis  right internal iliac  node and a 1.4 cm short axis left pelvic sidewall node.  Large cervical mass measures 7 cm transversely. Poor fat plane definition noted between this mass in the posterior urinary bladder  wall. A nonspecific 2.3 cm hypodense lesion of the left ovary is observed. The right ovary is somewhat indistinct. Small bilateral inguinal lymph nodes are not pathologically enlarged by size criteria.   IMPRESSION:  1. Cervical mass with bilateral internal iliac and left pelvic sidewall pathologic adenopathy. A definite intact fat plane between the mass in the posterior urinary bladder is difficult to ensure. The mass does not appear to directly involve the rectum.  2. Probable low density cystic lesion of the inferior left ovary, probably a follicle or small complex cyst   She was treated with radiation with concurrent chemosensitization. She did have a PET CT 03/22/2012 that revealed uptake in the retroperitoneal and pelvic nodes and a cervical mass. She also had mild fullness of the right renal collecting system. She received external beam radiation with Dr. Sondra Come from 04/05/2012 through 05/17/2012 which was 45 cGy to the pelvis and periaortic region. She then had 5 weekly high-dose rate treatments from January 27 through 06/16/2012. She had 2 weekly treatments of cisplatin on April 10, 2012, April 24, 2012, and 05/01/2012. It was commented about hypokalemia and hypomagnesemia for diarrhea in addition to his cisplatin. That was initially thought to be due to radiation therapy and then on May 03, 2012 she was found to be C. difficile positive. The radiation and chemotherapy were held for the C. difficile colitis.  Interval History:  I last saw her in March 2015 and she's not been seen by  [place] since that time. She was seen in radiation oncology by Dr. Sondra Come 6/17 at which time her pap smear was normal. She comes in today for follow up.  Review of Systems  Constitutional: Denies  fever. Skin: No rash, sores, jaundice, itching, or dryness.  Cardiovascular: No chest pain, shortness of breath, or edema  Pulmonary: No cough or wheeze.  Gastro Intestinal: Reporting intermittent lower abdominal soreness.  No nausea, vomiting, constipation, or diarrhea reported. No bright red blood per rectum or change in bowel movement.  Genitourinary: No frequency, urgency, or dysuria.  Denies vaginal bleeding and discharge.  Musculoskeletal: No myalgia, arthralgia, joint swelling or pain.  Neurologic: No weakness, numbness, or change in gait.  Psychology: Reporting "feeling depressed" due to decreased activity and absence from work.  Insomnia reported with two to three hot flashes a night. :  Current Meds:  No outpatient encounter prescriptions on file as of 04/14/2016.   Facility-Administered Encounter Medications as of 04/14/2016  Medication  . 0.9 %  sodium chloride infusion  . acetaminophen (TYLENOL) tablet 650 mg    Allergy:  Allergies  Allergen Reactions  . Shellfish Allergy Anaphylaxis  . Adhesive [Tape] Rash    Paper tape ok    Social Hx:   Social History   Social History  . Marital status: Married    Spouse name: N/A  . Number of children: 3  . Years of education: N/A   Occupational History  . CNA [place]   Social History Main Topics  . Smoking status: Never Smoker  . Smokeless tobacco: Never Used  . Alcohol use Yes     Comment: occ  . Drug use: No  . Sexual activity: Not Currently   Other Topics Concern  . Not on file   Social History Narrative  . No narrative on file    Past Surgical Hx:  Past Surgical History:  Procedure Laterality Date  . CERVICAL BIOPSY  02/28/2012   poorly differentiated squamous cell ca  . TANDEM RING INSERTION  05/22/2012   Procedure: TANDEM RING INSERTION;  Surgeon: Blair Promise, MD;  Location: [facility];  Service: Urology;  Laterality: N/A;  . TANDEM  RING INSERTION  05/29/2012   Procedure: TANDEM RING INSERTION;  Surgeon: Blair Promise, MD;  Location: [facility];  Service: Urology;  Laterality: N/A;  . TANDEM RING INSERTION N/A 06/05/2012   Procedure: TANDEM RING INSERTION;  Surgeon: Blair Promise, MD;  Location: [facility];  Service: Urology;  Laterality: N/A;  . TANDEM RING INSERTION N/A 06/12/2012   Procedure: TANDEM RING INSERTION;  Surgeon: Blair Promise, MD;  Location: [facility];  Service: Urology;  Laterality: N/A;  . TANDEM RING INSERTION N/A 06/19/2012   Procedure: TANDEM RING INSERTION;  Surgeon: Blair Promise, MD;  Location: [facility];  Service: Urology;  Laterality: N/A;  . TUBAL LIGATION  2006    Past Medical Hx:  Past Medical History:  Diagnosis Date  . Anemia   . Cervical cancer (HCC)    Squamous cell  . Hx of radiation therapy 04/05/12- 05/17/12, external beam, HDR 1/27, 2/3, 2/10, 2/17, 06/19/12   pelvis periaortic area 45 gray 25 fx, cervix boosted w/HDR x 5 treatmetns at 6 gray per fraction    Oncology Hx:    Cervical cancer ([place])   03/06/2012 Initial Diagnosis    Cervical cancer, IIB SCCA  04/05/2012 - 05/17/2012 Radiation Therapy    Vaginal brachytherapy 05/22/12-06/16/12      04/10/2012 - 05/01/2012 Chemotherapy    Cisplatin       Family Hx:  Family History  Problem Relation Age of Onset  . Diabetes Mother   . Hypertension Mother     Vitals:  There were no vitals taken for this visit.  Physical Exam:   Assessment/Plan:   Peggy Pitts A., MD 04/14/2016, 11:31 AM

## 2016-04-22 ENCOUNTER — Other Ambulatory Visit: Payer: Self-pay | Admitting: Nurse Practitioner

## 2016-07-05 ENCOUNTER — Emergency Department (HOSPITAL_COMMUNITY): Payer: Self-pay

## 2016-07-05 ENCOUNTER — Encounter (HOSPITAL_COMMUNITY): Payer: Self-pay | Admitting: Emergency Medicine

## 2016-07-05 ENCOUNTER — Emergency Department (HOSPITAL_COMMUNITY)
Admission: EM | Admit: 2016-07-05 | Discharge: 2016-07-05 | Disposition: A | Discharge status: Home / Self Care | Payer: Self-pay | Attending: Emergency Medicine | Admitting: Emergency Medicine

## 2016-07-05 DIAGNOSIS — Z8541 Personal history of malignant neoplasm of cervix uteri: Secondary | ICD-10-CM | POA: Insufficient documentation

## 2016-07-05 DIAGNOSIS — R072 Precordial pain: Secondary | ICD-10-CM | POA: Insufficient documentation

## 2016-07-05 DIAGNOSIS — R0782 Intercostal pain: Secondary | ICD-10-CM

## 2016-07-05 LAB — D-DIMER, QUANTITATIVE (NOT AT ARMC): D DIMER QUANT: 0.41 ug{FEU}/mL (ref 0.00–0.50)

## 2016-07-05 LAB — BASIC METABOLIC PANEL
ANION GAP: 11 (ref 5–15)
BUN: 13 mg/dL (ref 6–20)
CALCIUM: 9.5 mg/dL (ref 8.9–10.3)
CO2: 26 mmol/L (ref 22–32)
Chloride: 103 mmol/L (ref 101–111)
Creatinine, Ser: 0.89 mg/dL (ref 0.44–1.00)
GLUCOSE: 118 mg/dL — AB (ref 65–99)
POTASSIUM: 3.6 mmol/L (ref 3.5–5.1)
Sodium: 140 mmol/L (ref 135–145)

## 2016-07-05 LAB — CBC
HEMATOCRIT: 36.8 % (ref 36.0–46.0)
HEMOGLOBIN: 12 g/dL (ref 12.0–15.0)
MCH: 26.2 pg (ref 26.0–34.0)
MCHC: 32.6 g/dL (ref 30.0–36.0)
MCV: 80.3 fL (ref 78.0–100.0)
Platelets: 220 10*3/uL (ref 150–400)
RBC: 4.58 MIL/uL (ref 3.87–5.11)
RDW: 16.8 % — ABNORMAL HIGH (ref 11.5–15.5)
WBC: 7.2 10*3/uL (ref 4.0–10.5)

## 2016-07-05 LAB — I-STAT TROPONIN, ED: TROPONIN I, POC: 0 ng/mL (ref 0.00–0.08)

## 2016-07-05 MED ORDER — IBUPROFEN 800 MG PO TABS: 800.0000 mg | ORAL_TABLET | Freq: Three times a day (TID) | ORAL | 0 refills | Status: DC | PRN

## 2016-07-05 NOTE — Discharge Instructions (Signed)

## 2016-07-05 NOTE — ED Provider Notes (Signed)
Emergency Department Provider Note   I have reviewed the triage vital signs and the nursing notes.   HISTORY  Chief Complaint Chest Pain   HPI Peggy Pitts is a 36 y.o. female with PMH of cervical cancer presents to the emergency department for evaluation of right-sided chest pain last 3 weeks. The patient works as a [employer] manager and is very active at work with lots of lifting. No obvious inciting event or injury. Patient complains of right sided lower, lateral chest pain that is nonradiating. It is worse with bending/movement. Child describes it worse with deep inspiration. No change with exertion. She is tried over-the-counter medications with little to no relief. No fever or chills. No productive cough. Patient has no history of blood clots in legs or lungs. She is not on hormonal contraception. No lower extremity edema. No radiation of pain. Pain is moderate.   Past Medical History:  Diagnosis Date  . Anemia   . Cervical cancer (HCC)    Squamous cell  . Hx of radiation therapy 04/05/12- 05/17/12, external beam, HDR 1/27, 2/3, 2/10, 2/17, 06/19/12   pelvis periaortic area 45 gray 25 fx, cervix boosted w/HDR x 5 treatmetns at 6 gray per fraction    Patient Active Problem List   Diagnosis Date Noted  . Hx of radiation therapy   . Cervical cancer ([place]) 03/06/2012  . Cervical mass 02/28/2012    Past Surgical History:  Procedure Laterality Date  . CERVICAL BIOPSY  02/28/2012   poorly differentiated squamous cell ca  . TANDEM RING INSERTION  05/22/2012   Procedure: TANDEM RING INSERTION;  Surgeon: Blair Promise, MD;  Location: [facility];  Service: Urology;  Laterality: N/A;  . TANDEM RING INSERTION  05/29/2012   Procedure: TANDEM RING INSERTION;  Surgeon: Blair Promise, MD;  Location: [facility];  Service: Urology;  Laterality: N/A;  . TANDEM RING INSERTION N/A 06/05/2012   Procedure: TANDEM RING INSERTION;  Surgeon: Blair Promise, MD;  Location:  [facility];  Service: Urology;  Laterality: N/A;  . TANDEM RING INSERTION N/A 06/12/2012   Procedure: TANDEM RING INSERTION;  Surgeon: Blair Promise, MD;  Location: [facility];  Service: Urology;  Laterality: N/A;  . TANDEM RING INSERTION N/A 06/19/2012   Procedure: TANDEM RING INSERTION;  Surgeon: Blair Promise, MD;  Location: [facility]  Service: Urology;  Laterality: N/A;  . TUBAL LIGATION  2006      Allergies Shellfish allergy and Adhesive [tape]  Family History  Problem Relation Age of Onset  . Diabetes Mother   . Hypertension Mother     Social History Social History  Substance Use Topics  . Smoking status: Never Smoker  . Smokeless tobacco: Never Used  . Alcohol use Yes     Comment: occ    Review of Systems  Constitutional: No fever/chills Eyes: No visual changes. ENT: No sore throat. Cardiovascular: Positive chest pain. Respiratory: Denies shortness of breath. Gastrointestinal: No abdominal pain.  No nausea, no vomiting.  No diarrhea.  No constipation. Genitourinary: Negative for dysuria. Musculoskeletal: Negative for back pain. Skin: Negative for rash. Neurological: Negative for headaches, focal weakness or numbness.  10-point ROS otherwise negative.  ____________________________________________   PHYSICAL EXAM:  VITAL SIGNS: ED Triage Vitals  Enc Vitals Group     BP 07/05/16 0827 131/85     Pulse Rate 07/05/16 0827 105     Resp 07/05/16 0827 19  Temp 07/05/16 0827 99 F (37.2 C)     Temp Source 07/05/16 0827 Oral     SpO2 07/05/16 0827 99 %     Weight 07/05/16 0827 260 lb (117.9 kg)     Height 07/05/16 0827 5\' 6"  (1.676 m)     Pain Score 07/05/16 0836 7   Constitutional: Alert and oriented. Well appearing and in no acute distress. Eyes: Conjunctivae are normal.  Head: Atraumatic. Nose: No congestion/rhinnorhea. Mouth/Throat: Mucous membranes are moist. Neck: No stridor.   Cardiovascular:  Normal rate, regular rhythm. Good peripheral circulation. Grossly normal heart sounds.   Respiratory: Normal respiratory effort.  No retractions. Lungs CTAB. Gastrointestinal: Soft and nontender. No distention.  Musculoskeletal: No lower extremity tenderness nor edema. No gross deformities of extremities. Neurologic:  Normal speech and language. No gross focal neurologic deficits are appreciated.  Skin:  Skin is warm, dry and intact. No rash noted. Psychiatric: Mood and affect are normal. Speech and behavior are normal.  ____________________________________________   LABS (all labs ordered are listed, but only abnormal results are displayed)  Labs Reviewed  BASIC METABOLIC PANEL - Abnormal; Notable for the following:       Result Value   Glucose, Bld 118 (*)    All other components within normal limits  CBC - Abnormal; Notable for the following:    RDW 16.8 (*)    All other components within normal limits  D-DIMER, QUANTITATIVE (NOT AT [facility])  I-STAT TROPOININ, ED   ____________________________________________  EKG   EKG Interpretation  Date/Time:  Monday July 05 2016 08:40:02 EDT Ventricular Rate:  98 PR Interval:  132 QRS Duration: 92 QT Interval:  358 QTC Calculation: 457 R Axis:   61 Text Interpretation:  Normal sinus rhythm Incomplete right bundle branch block Borderline ECG No STEMI. No prior for comparison.  Confirmed by LONG MD, JOSHUA [phone]) on 07/05/2016 9:14:01 AM       ____________________________________________  RADIOLOGY  Dg Chest 2 View  Result Date: 07/05/2016 CLINICAL DATA:  Two week history of right-sided chest pain EXAM: CHEST  2 VIEW COMPARISON:  January 17, 2014 FINDINGS: There is no edema or consolidation. Heart size and pulmonary vascularity are normal. No adenopathy. No pneumothorax. No bone lesions. IMPRESSION: No edema or consolidation. Electronically Signed   By: Lowella Grip III M.D.   On: 07/05/2016 09:01     ____________________________________________   PROCEDURES  Procedure(s) performed:   Procedures  None ____________________________________________   INITIAL IMPRESSION / ASSESSMENT AND PLAN / ED COURSE  Pertinent labs & imaging results that were available during my care of the patient were reviewed by me and considered in my medical decision making (see chart for details).  Patient resents to the emergency department for evaluation of right-sided, lateral chest pain for the past 3 weeks. It is worse with movement or deep inspiration. No hypoxemia. Patient does have mild tachycardia. Low risk for pulmonary embolism by [place] but unable to [facility] with tachycardia. EKG is nonspecific. Plan for d-dimer for further risk stratification for possible PE. Clinically, however, I suspect that the patient's symptoms are due to musculoskeletal strain rather than PE or ACS.   11:00 AM D-dimer negative. Troponin x 1 negative. Will not trend with no constant pain for weeks. Discussed PCP follow up and OTC medication. Provided work note with lifting weight restrictions. Discuss return precautions in detail.   At this time, I do not feel there is any life-threatening condition present. I have reviewed and discussed all results (EKG,  imaging, lab, urine as appropriate), exam findings with patient. I have reviewed nursing notes and appropriate previous records.  I feel the patient is safe to be discharged home without further emergent workup. Discussed usual and customary return precautions. Patient and family (if present) verbalize understanding and are comfortable with this plan.  Patient will follow-up with their primary care provider. If they do not have a primary care provider, information for follow-up has been provided to them. All questions have been answered.  ____________________________________________  FINAL CLINICAL IMPRESSION(S) / ED DIAGNOSES  Final diagnoses:  Intercostal pain      MEDICATIONS GIVEN DURING THIS VISIT:  None  NEW OUTPATIENT MEDICATIONS STARTED DURING THIS VISIT:  Discharge Medication List as of 07/05/2016 11:56 AM    START taking these medications   Details  ibuprofen (ADVIL,MOTRIN) 800 MG tablet Take 1 tablet (800 mg total) by mouth every 8 (eight) hours as needed., Starting Mon 07/05/2016, Print         Note:  This document was prepared using Dragon voice recognition software and may include unintentional dictation errors.  Nanda Quinton, MD Emergency Medicine   Margette Fast, MD 07/05/16 [phone]

## 2016-07-05 NOTE — ED Triage Notes (Signed)
Pt reports right sided rib pain x2 weeks, denies sob, denies fall or injury to area. Pt reports she lifts a lot for her job.

## 2016-10-14 ENCOUNTER — Ambulatory Visit
Admission: RE | Admit: 2016-10-14 | Discharge: 2016-10-14 | Disposition: A | Discharge status: Home / Self Care | Payer: 59 | Source: Ambulatory Visit | Attending: Radiation Oncology | Admitting: Radiation Oncology

## 2016-10-14 ENCOUNTER — Other Ambulatory Visit (HOSPITAL_COMMUNITY)
Admission: RE | Admit: 2016-10-14 | Discharge: 2016-10-14 | Disposition: A | Discharge status: Home / Self Care | Payer: 59 | Source: Ambulatory Visit | Attending: Radiation Oncology | Admitting: Radiation Oncology

## 2016-10-14 ENCOUNTER — Encounter: Payer: Self-pay | Admitting: Radiation Oncology

## 2016-10-14 VITALS — BP 130/89 | HR 108 | Temp 98.9°F | Ht 66.0 in | Wt 275.4 lb

## 2016-10-14 DIAGNOSIS — Z91013 Allergy to seafood: Secondary | ICD-10-CM | POA: Diagnosis not present

## 2016-10-14 DIAGNOSIS — Z01411 Encounter for gynecological examination (general) (routine) with abnormal findings: Secondary | ICD-10-CM | POA: Insufficient documentation

## 2016-10-14 DIAGNOSIS — Z791 Long term (current) use of non-steroidal anti-inflammatories (NSAID): Secondary | ICD-10-CM | POA: Insufficient documentation

## 2016-10-14 DIAGNOSIS — Z91048 Other nonmedicinal substance allergy status: Secondary | ICD-10-CM | POA: Diagnosis not present

## 2016-10-14 DIAGNOSIS — C539 Malignant neoplasm of cervix uteri, unspecified: Secondary | ICD-10-CM | POA: Insufficient documentation

## 2016-10-14 DIAGNOSIS — Z79899 Other long term (current) drug therapy: Secondary | ICD-10-CM | POA: Diagnosis not present

## 2016-10-14 NOTE — Progress Notes (Signed)
Radiation Oncology         [phone] ________________________________  Name: Peggy Pitts MRN: [id number]  Date: 10/14/2016  DOB: 1980/07/24  Follow-Up Visit Note  CC: Patient, No Pcp Per  Default, Provider, MD    ICD-10-CM   1. 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 She reports having discomfort in her lower pelvic area that comes and goes.  She denies having any bladder/bowel issues.  She reports having discomfort and bleeding after intercourse.  She denies having fatigue. Patient did not f/u with Dr. Alycia Rossetti 6 months ago as planned as she had moved to the [place] area temporarily. She is very busy with work and working two full time jobs to make ends meet.   ALLERGIES:  is allergic to shellfish allergy and adhesive [tape].  Meds: Current Outpatient Prescriptions  Medication Sig Dispense Refill  . ibuprofen (ADVIL,MOTRIN) 800 MG tablet Take 1 tablet (800 mg total) by mouth every 8 (eight) hours as needed. 21 tablet 0   No current facility-administered medications for this encounter.    Facility-Administered Medications Ordered in Other Encounters  Medication Dose Route Frequency Provider Last Rate Last Dose  . 0.9 %  sodium chloride infusion  250 mL Intravenous Once Livesay, Lennis P, MD      . acetaminophen (TYLENOL) tablet 650 mg  650 mg Oral Once Livesay, Tamala Julian, MD        Physical Findings: The patient is in no acute distress. Patient is alert and oriented.  height is 5\' 6"  (1.676 m) and weight is 275 lb 6.4 oz (124.9 kg). Her oral temperature is 98.9 F (37.2 C). Her blood pressure is 130/89 and her pulse is 108 (abnormal). Her oxygen saturation is 99%.   No palpable subclavicular or axillary adenopathy. The lungs are clear to auscultation. The heart has regular rhythm and rate. The abdomen is soft and nontender with normal bowel sounds. No inguinal adenopathy appreciated.  On speculum exam patient was noted to have somewhat narrowed vaginal vault that does permit medium speculum. I was unable to identify the cervical os. On bimanual and rectovaginal examination no pelvic masses were appreciated. A Pap smear was obtained of the proximal vagina. There appear to be some adhesions on bimanual exam in the upper vaginal area. The mucosa is fragile and bleeds easily during the Pap smear.  Lab Findings: Lab Results  Component Value Date   WBC 7.2 07/05/2016   HGB 12.0 07/05/2016   HCT 36.8 07/05/2016   MCV 80.3 07/05/2016   PLT 220 07/05/2016    Radiographic Findings: No results found.  Impression:  No evidence of recurrence on clinical exam today. Patient's mucosa is somewhat friable and this may be explaining her postcoital bleeding. She would likely need to be placed on an estrogen replacement regimen, particularly given her young age.   Plan:  Pt will be referred back to gynecologic oncology for above issue. F/u with radiation  oncology in 6 months. Will f/u on results of Pap smear from earlier today.  ____________________________________ -----------------------------------  Blair Promise, PhD, MD  This document serves as a record of services personally performed by Gery Pray, MD. It was created on his behalf by Linward Natal, a trained medical scribe. The creation of this record is based on the scribe's personal observations and the  provider's statements to them. This document has been checked and approved by the attending provider.

## 2016-10-14 NOTE — Progress Notes (Signed)
Peggy Pitts is here for follow up.  She reports having discomfort in her lower pelvic area that comes and goes.  She denies having any bladder/bowel issues.  She reports having discomfort and bleeding after intercourse.  She denies having fatigue.    BP 130/89 (BP Location: Right Arm, Patient Position: Sitting)   Pulse (!) 108   Temp 98.9 F (37.2 C) (Oral)   Ht 5\' 6"  (1.676 m)   Wt 275 lb 6.4 oz (124.9 kg)   SpO2 99%   BMI 44.45 kg/m    Wt Readings from Last 3 Encounters:  10/14/16 275 lb 6.4 oz (124.9 kg)  07/05/16 260 lb (117.9 kg)  10/15/15 272 lb 9.6 oz (123.7 kg)

## 2016-10-18 ENCOUNTER — Telehealth: Payer: Self-pay | Admitting: *Deleted

## 2016-10-18 NOTE — Telephone Encounter (Signed)
Attempted to contact the patient to schedule follow up appt with Dr. Alycia Rossetti, no answer left message to call the office back.

## 2016-10-18 NOTE — Telephone Encounter (Signed)
Patient returned call and was scheduled for July 25th at 8am

## 2016-10-19 LAB — CYTOLOGY - PAP

## 2016-10-25 ENCOUNTER — Telehealth: Payer: Self-pay | Admitting: Oncology

## 2016-10-25 NOTE — Telephone Encounter (Signed)
Called patient and notified her of the good results on her pap smear per Dr. Sondra Come.  She verbalized understanding and agreement.

## 2016-11-17 ENCOUNTER — Ambulatory Visit: Payer: 59 | Admitting: Gynecologic Oncology

## 2017-04-09 ENCOUNTER — Emergency Department (HOSPITAL_COMMUNITY)
Admission: EM | Admit: 2017-04-09 | Discharge: 2017-04-09 | Disposition: A | Discharge status: Home / Self Care | Payer: 59 | Attending: Emergency Medicine | Admitting: Emergency Medicine

## 2017-04-09 ENCOUNTER — Encounter (HOSPITAL_COMMUNITY): Payer: Self-pay | Admitting: Emergency Medicine

## 2017-04-09 DIAGNOSIS — Z8541 Personal history of malignant neoplasm of cervix uteri: Secondary | ICD-10-CM | POA: Insufficient documentation

## 2017-04-09 DIAGNOSIS — R1031 Right lower quadrant pain: Secondary | ICD-10-CM

## 2017-04-09 DIAGNOSIS — N390 Urinary tract infection, site not specified: Secondary | ICD-10-CM | POA: Insufficient documentation

## 2017-04-09 LAB — URINALYSIS, ROUTINE W REFLEX MICROSCOPIC
BACTERIA UA: NONE SEEN
BILIRUBIN URINE: NEGATIVE
Glucose, UA: NEGATIVE mg/dL
HGB URINE DIPSTICK: NEGATIVE
Ketones, ur: NEGATIVE mg/dL
Nitrite: NEGATIVE
Protein, ur: NEGATIVE mg/dL
SPECIFIC GRAVITY, URINE: 1.013 (ref 1.005–1.030)
pH: 5 (ref 5.0–8.0)

## 2017-04-09 LAB — WET PREP, GENITAL
CLUE CELLS WET PREP: NONE SEEN
Sperm: NONE SEEN
TRICH WET PREP: NONE SEEN
YEAST WET PREP: NONE SEEN

## 2017-04-09 MED ORDER — AZITHROMYCIN 250 MG PO TABS
1000.0000 mg | ORAL_TABLET | Freq: Every day | ORAL | Status: DC
  Administered 2017-04-09: 1000 mg via ORAL
  Filled 2017-04-09: qty 4

## 2017-04-09 MED ORDER — CEFTRIAXONE SODIUM 250 MG IJ SOLR
250.0000 mg | Freq: Once | INTRAMUSCULAR | Status: AC
  Administered 2017-04-09: 250 mg via INTRAMUSCULAR
  Filled 2017-04-09: qty 250

## 2017-04-09 MED ORDER — LIDOCAINE HCL (PF) 1 % IJ SOLN
INTRAMUSCULAR | Status: AC
  Administered 2017-04-09: 2.1 mL
  Filled 2017-04-09: qty 5

## 2017-04-09 MED ORDER — CEPHALEXIN 500 MG PO CAPS: 500.0000 mg | ORAL_CAPSULE | Freq: Two times a day (BID) | ORAL | 0 refills | Status: AC

## 2017-04-09 NOTE — ED Provider Notes (Signed)
[facility] DEPT Provider Note   CSN: [id number] Arrival date & time: 04/09/17  9211     History   Chief Complaint Chief Complaint  Patient presents with  . Vaginal Pain    HPI Peggy Pitts is a 36 y.o. female.  HPI  Using vibrator, it disassembled. Thinks she was able to remove all of the pieces, but is now having pain in right groin.  Incident occurred 2 weeks ago.  Felt uncomfortable for the past 2 weeks but thought it was related to cervical cancer.  Reports yesterday bent over and felt like something shifted.  Reports mild discomfort, not pain, rates as a 5.     No nausea, no vomiting, no discharge, no dysuria.  No appetite change, no diarrhea, no fevers.   Past Medical History:  Diagnosis Date  . Anemia   . Cervical cancer (HCC)    Squamous cell  . Hx of radiation therapy 04/05/12- 05/17/12, external beam, HDR 1/27, 2/3, 2/10, 2/17, 06/19/12   pelvis periaortic area 45 gray 25 fx, cervix boosted w/HDR x 5 treatmetns at 6 gray per fraction    Patient Active Problem List   Diagnosis Date Noted  . Hx of radiation therapy   . Cervical cancer ([place]) 03/06/2012  . Cervical mass 02/28/2012    Past Surgical History:  Procedure Laterality Date  . CERVICAL BIOPSY  02/28/2012   poorly differentiated squamous cell ca  . TANDEM RING INSERTION  05/22/2012   Procedure: TANDEM RING INSERTION;  Surgeon: Blair Promise, MD;  Location: [facility];  Service: Urology;  Laterality: N/A;  . TANDEM RING INSERTION  05/29/2012   Procedure: TANDEM RING INSERTION;  Surgeon: Blair Promise, MD;  Location: [facility];  Service: Urology;  Laterality: N/A;  . TANDEM RING INSERTION N/A 06/05/2012   Procedure: TANDEM RING INSERTION;  Surgeon: Blair Promise, MD;  Location: [facility];  Service: Urology;  Laterality: N/A;  . TANDEM RING INSERTION N/A 06/12/2012   Procedure: TANDEM RING INSERTION;  Surgeon: Blair Promise, MD;  Location: [facility];  Service: Urology;  Laterality: N/A;  . TANDEM RING INSERTION N/A 06/19/2012   Procedure: TANDEM RING INSERTION;  Surgeon: Blair Promise, MD;  Location: [facility];  Service: Urology;  Laterality: N/A;  . TUBAL LIGATION  2006    OB History    Gravida Para Term Preterm AB Living   3 3 3     3    SAB TAB Ectopic Multiple Live Births                   Home Medications    Prior to Admission medications   Not on File    Family History Family History  Problem Relation Age of Onset  . Diabetes Mother   . Hypertension Mother     Social History Social History   Tobacco Use  . Smoking status: Never Smoker  . Smokeless tobacco: Never Used  Substance Use Topics  . Alcohol use: Yes    Comment: occ  . Drug use: No     Allergies   Shellfish allergy and Adhesive [tape]   Review of Systems Review of Systems  Constitutional: Negative for fever.  HENT: Negative for sore throat.   Eyes: Negative for visual disturbance.  Respiratory: Negative for cough and shortness of breath.   Cardiovascular: Negative for chest pain.  Gastrointestinal: Negative for abdominal pain, nausea and  vomiting.  Genitourinary: Negative for difficulty urinating and dysuria.  Musculoskeletal: Negative for back pain and neck pain.  Skin: Negative for rash.  Neurological: Negative for syncope and headaches.     Physical Exam Updated Vital Signs BP (!) 145/84 (BP Location: Left Arm)   Pulse (!) 101   Temp 98.2 F (36.8 C) (Oral)   Resp 17   SpO2 97%   Physical Exam  Constitutional: She is oriented to person, place, and time. She appears well-developed and well-nourished. No distress.  HENT:  Head: Normocephalic and atraumatic.  Eyes: Conjunctivae and EOM are normal.  Neck: Normal range of motion.  Cardiovascular: Normal rate, regular rhythm and intact distal pulses.  Pulmonary/Chest: Effort normal. No respiratory distress.    Abdominal: Soft. She exhibits no distension. There is no tenderness. There is no guarding.  No tenderness on exam, points to right groin, do not detect hernia, abscess or other skin abnormalit7  Genitourinary:  Genitourinary Comments: No cervix/os on speculum or bimanual exam Denies significant tenderness, reports mild right sided pressure with right adnexal exam  Musculoskeletal: She exhibits no edema or tenderness.  Neurological: She is alert and oriented to person, place, and time.  Skin: Skin is warm and dry. No rash noted. She is not diaphoretic. No erythema.  Nursing note and vitals reviewed.    ED Treatments / Results  Labs (all labs ordered are listed, but only abnormal results are displayed) Labs Reviewed  WET PREP, GENITAL - Abnormal; Notable for the following components:      Result Value   WBC, Wet Prep HPF POC MODERATE (*)    All other components within normal limits  URINALYSIS, ROUTINE W REFLEX MICROSCOPIC - Abnormal; Notable for the following components:   Leukocytes, UA MODERATE (*)    Squamous Epithelial / LPF 0-5 (*)    All other components within normal limits  GC/CHLAMYDIA PROBE AMP () NOT AT [facility]    EKG  EKG Interpretation None       Radiology No results found.  Procedures Procedures (including critical care time)  Medications Ordered in ED Medications  azithromycin (ZITHROMAX) tablet 1,000 mg (1,000 mg Oral Given 04/09/17 1023)  cefTRIAXone (ROCEPHIN) injection 250 mg (250 mg Intramuscular Given 04/09/17 1022)  lidocaine (PF) (XYLOCAINE) 1 % injection (2.1 mLs  Given 04/09/17 1023)     Initial Impression / Assessment and Plan / ED Course  I have reviewed the triage vital signs and the nursing notes.  Pertinent labs & imaging results that were available during my care of the patient were reviewed by me and considered in my medical decision making (see chart for details).     36 year old female with history of cervical cancer  presents with concern for right groin pain, concern for possible retained vaginal foreign body.  Exam, history not consistent with incarcerated hernia, appendicitis, or tubo-ovarian abscess.  Pelvic exam shows no sign of vaginal foreign body both on speculum and bimanual exam.  Do not detect discharge.  Offered treatment and testing for GC/Chl which patient would like.  UA shows sign of possible UTI.  Will treat with keflex.   Patient to follow up with cervical cancer physician on Monday. Patient discharged in stable condition with understanding of reasons to return.   Final Clinical Impressions(s) / ED Diagnoses   Final diagnoses:  Right groin pain  Urinary tract infection without hematuria, site unspecified    ED Discharge Orders    None       Gareth Morgan,  MD 04/09/17 1043

## 2017-04-09 NOTE — ED Triage Notes (Signed)
Pt reports she got a toy stuck in her vagina several days ago. Pt thinks she got most of it out, but thinks some may still be stuck. No vaginal odor or discharge.

## 2017-04-11 ENCOUNTER — Telehealth: Payer: Self-pay | Admitting: Oncology

## 2017-04-11 ENCOUNTER — Ambulatory Visit: Payer: No Typology Code available for payment source | Attending: Radiation Oncology | Admitting: Radiation Oncology

## 2017-04-11 LAB — GC/CHLAMYDIA PROBE AMP (~~LOC~~) NOT AT ARMC
CHLAMYDIA, DNA PROBE: POSITIVE — AB
NEISSERIA GONORRHEA: NEGATIVE

## 2017-04-11 NOTE — Telephone Encounter (Signed)
Left a message for patient regarding her appointment today.  Requested a return call to reschedule.

## 2017-08-09 IMAGING — CR DG CHEST 2V
2 series · 2 of 2 positions shown · non-contrast
Comparison: January 17, 2014

CLINICAL DATA: Two week history of right-sided chest pain

EXAM:
CHEST  2 VIEW

[chest pa]
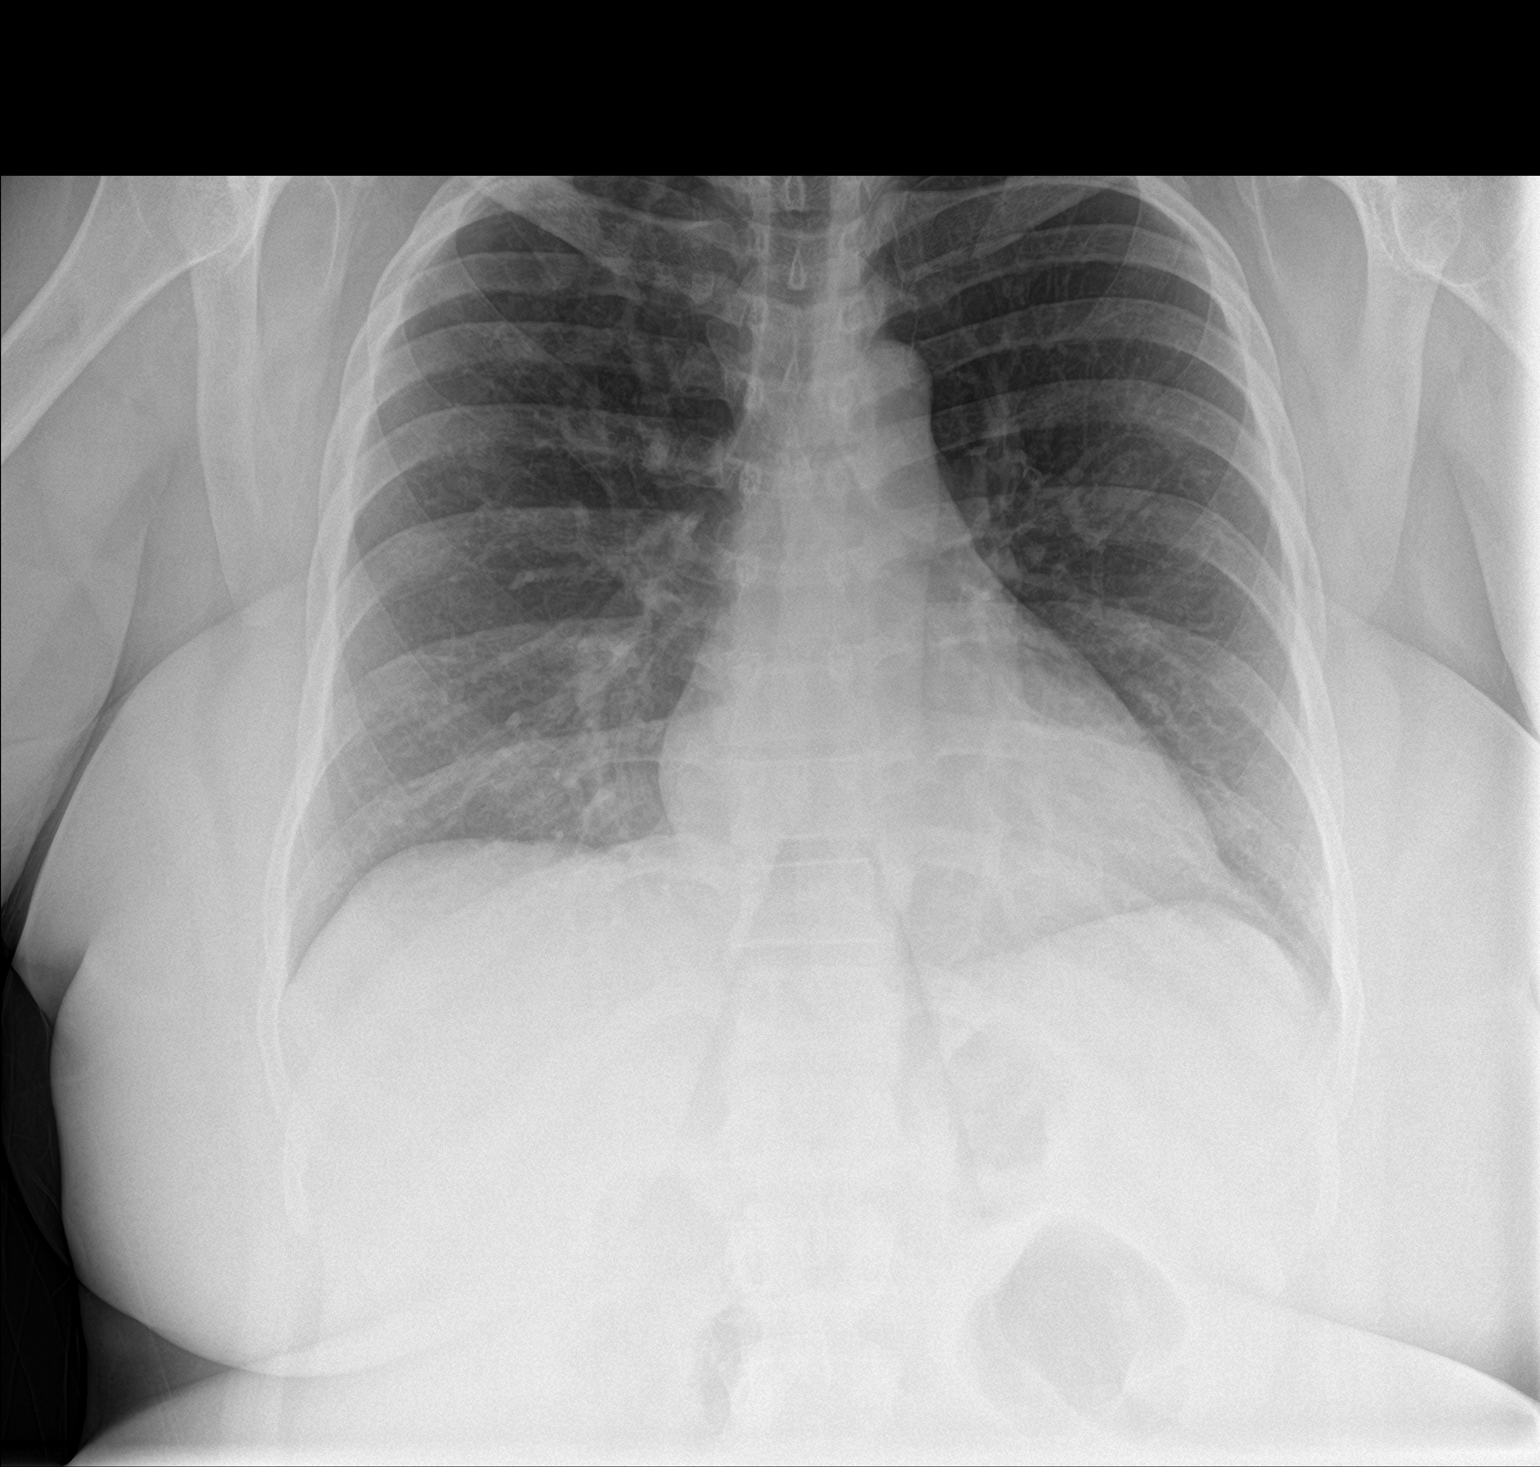

[chest lat]
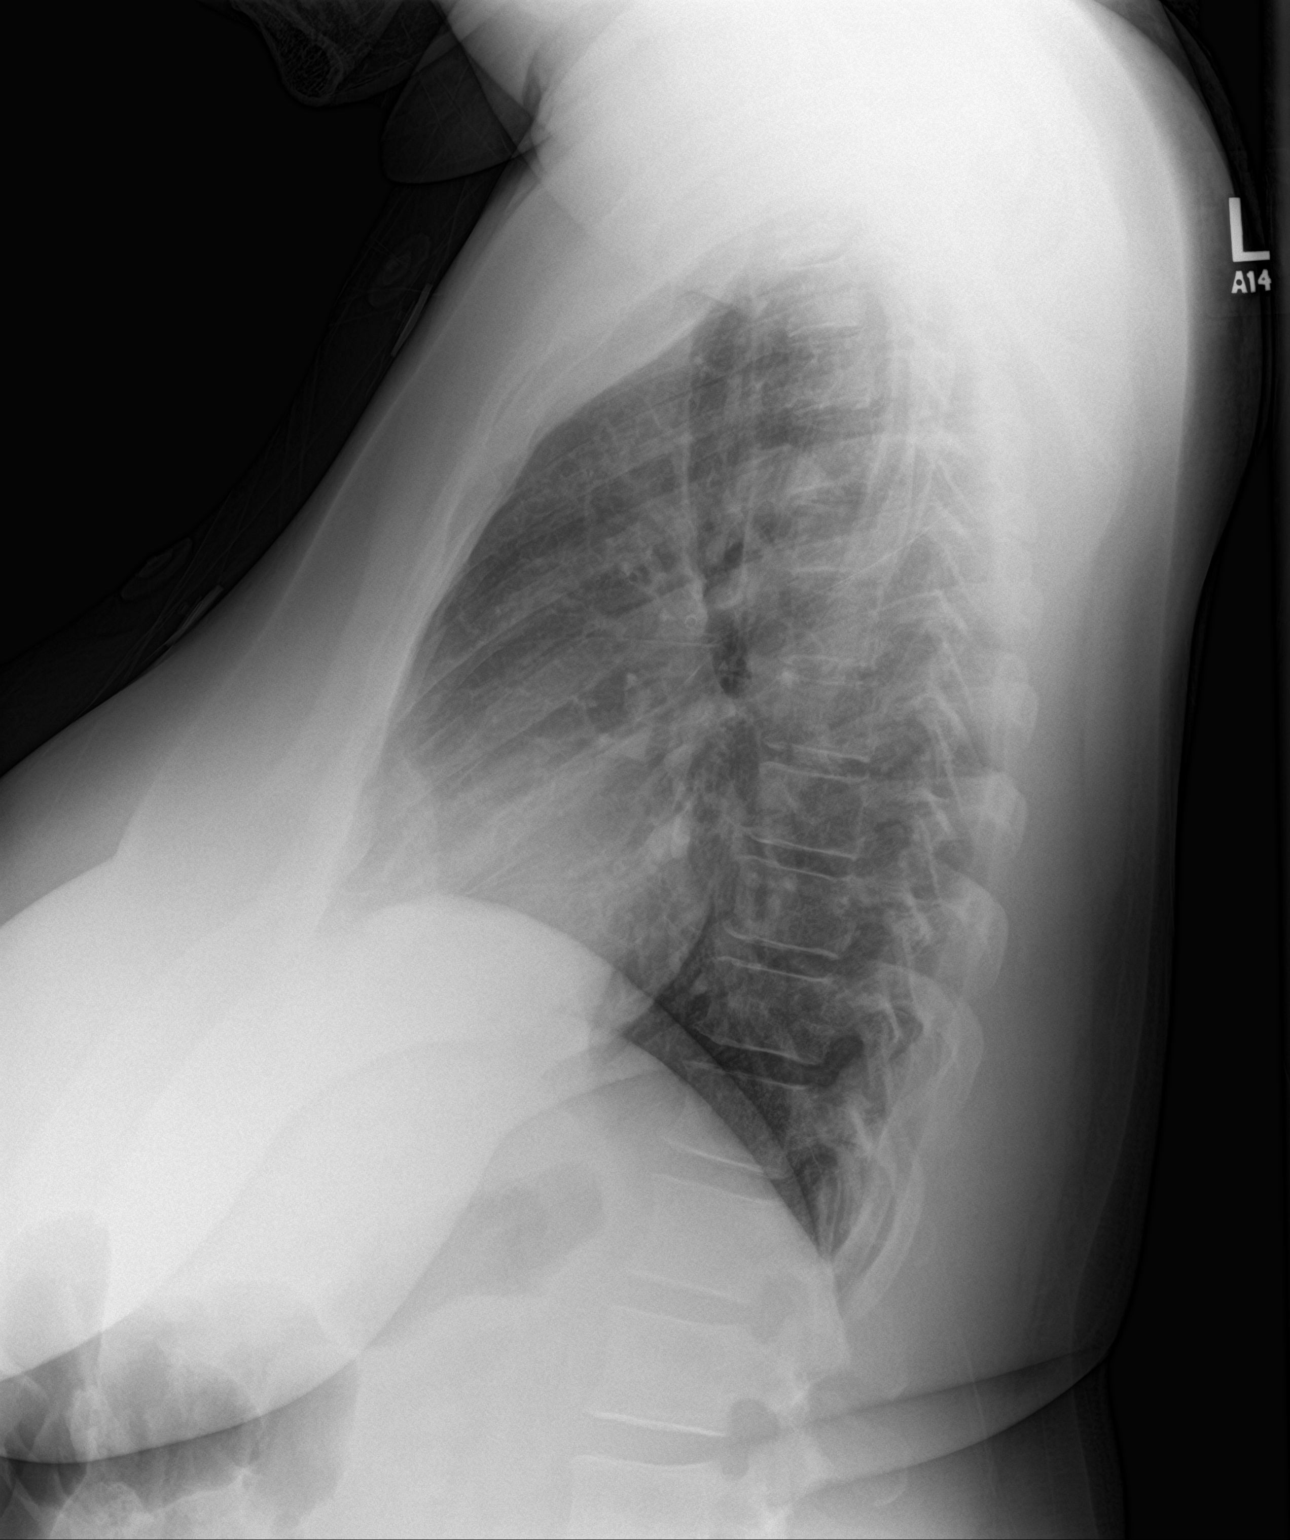

[2 of 2 positions shown; findings below may reference images not displayed]

FINDINGS: There is no edema or consolidation. Heart size and pulmonary
vascularity are normal. No adenopathy. No pneumothorax. No bone
lesions.
IMPRESSION: No edema or consolidation.
